# Patient Record
Sex: Male | Born: 1935 | Race: White | Hispanic: No | Smoking: Never smoker
Health system: Southern US, Community
[De-identification: ages and names within clinical notes are randomized; demographics above are authoritative.]

---

## 2008-07-27 ENCOUNTER — Inpatient Hospital Stay (HOSPITAL_COMMUNITY): Admission: EM | Admit: 2008-07-27 | Discharge: 2008-07-29 | Payer: Self-pay | Admitting: Emergency Medicine

## 2008-07-28 ENCOUNTER — Encounter (INDEPENDENT_AMBULATORY_CARE_PROVIDER_SITE_OTHER): Payer: Self-pay | Admitting: Internal Medicine

## 2010-05-17 LAB — APTT: aPTT: 32 s (ref 24–37)

## 2010-05-17 LAB — COMPREHENSIVE METABOLIC PANEL
ALT: 85 U/L — ABNORMAL HIGH (ref 0–53)
AST: 64 U/L — ABNORMAL HIGH (ref 0–37)
Albumin: 2.5 g/dL — ABNORMAL LOW (ref 3.5–5.2)
CO2: 22 mEq/L (ref 19–32)
Calcium: 8.7 mg/dL (ref 8.4–10.5)
Creatinine, Ser: 2.29 mg/dL — ABNORMAL HIGH (ref 0.4–1.5)
GFR calc Af Amer: 33 mL/min — ABNORMAL LOW (ref >60)
GFR calc non Af Amer: 28 mL/min — ABNORMAL LOW (ref >60)
Sodium: 130 mEq/L — ABNORMAL LOW (ref 135–145)
Total Protein: 5.9 g/dL — ABNORMAL LOW (ref 6.0–8.3)

## 2010-05-17 LAB — BASIC METABOLIC PANEL
Calcium: 8.8 mg/dL (ref 8.4–10.5)
GFR calc Af Amer: 34 mL/min — ABNORMAL LOW (ref >60)
GFR calc non Af Amer: 28 mL/min — ABNORMAL LOW (ref >60)
Potassium: 4.4 mEq/L (ref 3.5–5.1)
Sodium: 138 mEq/L (ref 135–145)

## 2010-05-17 LAB — DIFFERENTIAL
Basophils Absolute: 0 K/uL (ref 0.0–0.1)
Basophils Relative: 0 % (ref 0–1)
Eosinophils Absolute: 0.3 10*3/uL (ref 0.0–0.7)
Eosinophils Relative: 2 % (ref 0–5)
Lymphocytes Relative: 8 % — ABNORMAL LOW (ref 12–46)
Lymphs Abs: 0.9 10*3/uL (ref 0.7–4.0)
Monocytes Absolute: 0.6 10*3/uL (ref 0.1–1.0)
Monocytes Relative: 6 % (ref 3–12)
Neutro Abs: 9.1 K/uL — ABNORMAL HIGH (ref 1.7–7.7)
Neutrophils Relative %: 84 % — ABNORMAL HIGH (ref 43–77)

## 2010-05-17 LAB — COMPREHENSIVE METABOLIC PANEL WITH GFR
Alkaline Phosphatase: 81 U/L (ref 39–117)
BUN: 63 mg/dL — ABNORMAL HIGH (ref 6–23)
Chloride: 98 meq/L (ref 96–112)
Glucose, Bld: 116 mg/dL — ABNORMAL HIGH (ref 70–99)
Potassium: 4 meq/L (ref 3.5–5.1)
Total Bilirubin: 1 mg/dL (ref 0.3–1.2)

## 2010-05-17 LAB — URINALYSIS, ROUTINE W REFLEX MICROSCOPIC
Bilirubin Urine: NEGATIVE
Glucose, UA: NEGATIVE mg/dL
Hgb urine dipstick: NEGATIVE
Ketones, ur: NEGATIVE mg/dL
Nitrite: NEGATIVE
Protein, ur: NEGATIVE mg/dL
Specific Gravity, Urine: 1.012 (ref 1.005–1.030)
Urobilinogen, UA: 1 mg/dL (ref 0.0–1.0)
pH: 5 (ref 5.0–8.0)

## 2010-05-17 LAB — CBC
HCT: 35.9 % — ABNORMAL LOW (ref 39.0–52.0)
HCT: 37.9 % — ABNORMAL LOW (ref 39.0–52.0)
Hemoglobin: 12.2 g/dL — ABNORMAL LOW (ref 13.0–17.0)
Hemoglobin: 12.8 g/dL — ABNORMAL LOW (ref 13.0–17.0)
MCHC: 33.7 g/dL (ref 30.0–36.0)
MCV: 90.2 fL (ref 78.0–100.0)
Platelets: 207 10*3/uL (ref 150–400)
RBC: 3.96 MIL/uL — ABNORMAL LOW (ref 4.22–5.81)
RBC: 4.2 MIL/uL — ABNORMAL LOW (ref 4.22–5.81)
RDW: 14.9 % (ref 11.5–15.5)
WBC: 10.8 K/uL — ABNORMAL HIGH (ref 4.0–10.5)
WBC: 8.6 10*3/uL (ref 4.0–10.5)

## 2010-05-17 LAB — GLUCOSE, CAPILLARY
Glucose-Capillary: 113 mg/dL — ABNORMAL HIGH (ref 70–99)
Glucose-Capillary: 129 mg/dL — ABNORMAL HIGH (ref 70–99)
Glucose-Capillary: 142 mg/dL — ABNORMAL HIGH (ref 70–99)
Glucose-Capillary: 143 mg/dL — ABNORMAL HIGH (ref 70–99)
Glucose-Capillary: 144 mg/dL — ABNORMAL HIGH (ref 70–99)
Glucose-Capillary: 160 mg/dL — ABNORMAL HIGH (ref 70–99)
Glucose-Capillary: 191 mg/dL — ABNORMAL HIGH (ref 70–99)

## 2010-05-17 LAB — CULTURE, BLOOD (ROUTINE X 2): Culture: NO GROWTH

## 2010-05-17 LAB — BRAIN NATRIURETIC PEPTIDE: Pro B Natriuretic peptide (BNP): 58 pg/mL (ref 0.0–100.0)

## 2010-05-17 LAB — HEMOGLOBIN A1C: Hgb A1c MFr Bld: 6.3 % — ABNORMAL HIGH (ref 4.6–6.1)

## 2010-05-17 LAB — SEDIMENTATION RATE: Sed Rate: 74 mm/hr — ABNORMAL HIGH (ref 0–16)

## 2010-05-17 LAB — PROTIME-INR
INR: 1.1 (ref 0.00–1.49)
Prothrombin Time: 14.2 s (ref 11.6–15.2)

## 2010-05-17 LAB — HEPATIC FUNCTION PANEL
ALT: 71 U/L — ABNORMAL HIGH (ref 0–53)
AST: 51 U/L — ABNORMAL HIGH (ref 0–37)
Albumin: 2.4 g/dL — ABNORMAL LOW (ref 3.5–5.2)
Alkaline Phosphatase: 71 U/L (ref 39–117)
Bilirubin, Direct: 0.4 mg/dL — ABNORMAL HIGH (ref 0.0–0.3)
Total Bilirubin: 1.3 mg/dL — ABNORMAL HIGH (ref 0.3–1.2)

## 2010-05-17 LAB — WOUND CULTURE

## 2010-05-17 LAB — ANA: Anti Nuclear Antibody(ANA): NEGATIVE

## 2010-05-17 LAB — URINE CULTURE

## 2010-05-17 LAB — LIPID PANEL
HDL: 37 mg/dL — ABNORMAL LOW (ref >39)
Total CHOL/HDL Ratio: 2.9 RATIO

## 2010-06-22 NOTE — Discharge Summary (Signed)
NAME:  Brandon Floyd, Brandon Floyd NO.:  [id number]   MEDICAL RECORD NO.:  [id number]          PATIENT TYPE:  INP   LOCATION:  6706                         FACILITY:  [facility]   PHYSICIAN:  Charlestine Massed, MDDATE OF BIRTH:  09-05-1925   DATE OF ADMISSION:  07/27/2008  DATE OF DISCHARGE:  07/29/2008                               DISCHARGE SUMMARY   PRIMARY CARE PHYSICIAN:  Paulina Fusi, MD, at [facility].   DERMATOLOGIST:  Bufford Buttner, MD, [facility].   REASON FOR ADMISSION:  Multiple bullous lesions with ulcerations on the  upper and lower extremities and bilateral lower and upper extremity  edema.   DISCHARGE DIAGNOSES:  1. Chronic diastolic heart failure grade 1 with bilateral upper and      lower extremity edema.  2. Chronic kidney disease, stage III, currently stable.  3. Multiple skin blisters with ulcerations secondary to fluid overload      in both lower and upper extremities.  4. Topical wound infection with Staphylococcus aureus but no evidence      of active infection or cellulitis in the wound.  5. Hypertension, currently controlled.  6. Previous history of renal cell carcinoma status post nephrectomy on      the left side.  7. Status post implantable cardioverter-defibrillator placement - no      known reason available as of now.  8. Dyslipidemia.   DISCHARGE MEDICATIONS:  1. Bactrim DS 1 tablet p.o. b.i.d. x10 days.  2. Bactroban topical ointment topical application, twice daily to      wounds.  3. Lasix 80 mg p.o. daily a.m.  4. Toprol-XL 50 mg p.o. daily.  5. Hydralazine 20 mg p.o. t.i.d.   Continue the existing medications which he has been taking as:  1. Vytorin 10/20 half tablet daily.  2. Vicodin 5/500 one tablet q.6 hourly p.r.n.  3. Senna 1 tablet.  4. Soma 350 mg 1 tablet daily p.r.n.  5. Capoten 50 mg t.i.d.  6. Klor-Con 10 mEq p.o. daily.   The medication that were stopped were prednisone Robaxin,  Imdur.   HOSPITAL COURSE:  Mr. Brandon Floyd is an 77 year old gentleman who was  admitted with multiple ulcerations in both upper and lower extremities  associated with edema in both upper and lower extremities.  The patient  was initially evaluated, and initially he was found to have multiple  skin ulcers and the areas of edema present.  Dermatology consult was  called for and seen by Dr. Leonie Man who mentioned that the patient's  edema is the main reason for this ulceration.  There is no evidence of  active infection or cellulitis associated with the bones.  Samples of  culture from the wound was taken.  It is well known that the wound was  openly exposed all these days and the cultures grew Staphylococcus  aureus, sensitivity pending.  I spoke with the dermatologist who  suggested that if the culture is showing MRSA then the patient needs to  go on Bactrim DS and topical Bactroban will be enough for it otherwise,  with daily  wound care dressing and compression bandages for the fluid.  The patient's wound care has been set up.  He will be continued on  Bactrim DS for 10 days further and Bactroban topical ointment  applications.  Wound care advice has been given to clean the wound in  saline and apply topical Bactroban and then dry dressing twice daily  gained and apply compression bandages on top of them for the edema to  resolve.   The patient's condition was discussed with him and the family very well,  advised about decreasing fluid intake and daily weights to prevent  further fluid accumulation.  Family has expressed understanding about  it.   Wound Care has been set up for proper wound care at home as mentioned  earlier.   Diastolic heart failure.  The patient had an echocardiogram done at this  admission which reveals an ejection fraction of 55-60%, no wall motion  abnormalities, grade 1 diastolic dysfunction present.  PA pressure is  35.  There are no aortic stenosis or  aortic regurgitation or mitral  stenosis or mitral regurgitation noted.  The right ventricular function  was normal.  Systolic function was normal, and there was no pericardial  effusion.  In view of the diastolic dysfunction, the patient has been  started on Lasix 80 mg p.o. daily in morning.  He has been taken off the  Imdur because of the fact that the Imdur being a vasodilator can  increase lower extremity swelling.  He has been switched to hydralazine  daily and Lasix and Toprol-XL has been increased to 50 mg.   Hypertension.  Blood pressure is well controlled currently.  He has been  restarted on hydralazine for proper blood sugar and blood pressure  control and continue Toprol-XL and Lasix.   Peripheral edema.  The patient has been educated about keeping his leg  elevated while sleeping as well as to keep his upper extremities above  the level of the chest by using pillow while asleep.  Fluid restriction  to 1.2 L a day has been advised.  The patient has exhibited  understanding.  He has been advised to watch his weight, document every  other day weight using bathroom scale at the same spot every time and  log it and to contact the PMD if there is any sudden increase in the  body weight.   Dyslipidemia.  Continue Vytorin as before.   DISPOSITION:  Discharged back home.   FOLLOWUP:  1. Followup with Dr. Leonie Man, call [phone] for appointment to      follow up in 2-3 weeks.  2. Followup with primary MD, Dr. Tomasa Blase, in 1 week for blood work.   TESTS DONE DURING THIS ADMISSION:  1. BMP done on July 28, 2008, shows sodium 138, potassium 4.5,      chloride 104, bicarb 24, BUN 63, creatinine 2.28, and glucose 148.  2. The echocardiogram as dictated above showed grade 1 diastolic      dysfunction.  3. Renal ultrasound.  Left kidney is absent.  Right kidney is 12.2 in      size.  No hydronephrosis.  Simple cyst present.  Bladder is within      normal limits.  No obstructive  uropathy noted.  4. Chest x-ray, central bibasilar air space.  An AICD present on the      left side.  Cardiomegaly and basilar edema present.   Total of 40 minutes spent on this discharge.  Charlestine Massed, MD  Electronically Signed     UT/MEDQ  D:  07/29/2008  T:  07/30/2008  Job:  [id number]   cc:   Paulina Fusi, MD  Bufford Buttner, MD

## 2010-06-22 NOTE — H&P (Signed)
NAME:  Brandon Floyd, Brandon Floyd NO.:  [id number]   MEDICAL RECORD NO.:  [id number]          PATIENT TYPE:  EMS   LOCATION:  MAJO                         FACILITY:  [facility]   PHYSICIAN:  Eduard Clos, MDDATE OF BIRTH:  1925-06-14   DATE OF ADMISSION:  07/27/2008  DATE OF DISCHARGE:                              HISTORY & PHYSICAL   PRIMARY CARE PHYSICIAN:  Dr. Tomasa Blase at [place].   CHIEF COMPLAINT:  Increasing swelling all over the body.   HISTORY OF PRESENT ILLNESS:  An 77 year old male with known history of  CHF, systolic heart failure as per records, it shows 2008 EF was 40%,  status post ICD and defibrillator placement in [facility] with history  of renal cell carcinoma status post left-sided nephrectomy last year  with increasing swelling which started off 2 weeks ago.  The patient  stated that he had his right knee pain for which primary care doctor had  prescribed a tapering dose of prednisone which he had taken 3 days ago  and stopped.  He also noticed swelling starting off in lower extremities  along with some blisters developing in 3 areas of his body, posterior  and left ankle area, and now in the right ankle area and one in the  right elbow area.  He showed measuring at least 5 cm in diameter and is  weeping.  Has no obvious pus, but is serous and slightly thick.  The  patient denies any nausea, vomiting, abdominal pain, fever or chills,  difficulty breathing or tongue swelling or any blisters in his mouth.  Denies any headache.  Due to increasing swelling, the patient came to  the ER his creatinine was found to be 2.2 with all the swelling on x-ray  showing some edema.  The patient has been admitted for further  observation and management.  Presently the patient is not in any acute  distress.   PAST MEDICAL HISTORY:  Chronic kidney disease with history of left side  nephrectomy for renal cell carcinoma, hypertension, cardiomyopathy,  hyperlipidemia, ICD  placement.   PAST SURGICAL HISTORY:  Left sided nephrectomy for renal cell carcinoma  and ICD and pacemaker placement for cardiomyopathy.   MEDICATIONS ON ADMISSION:  1. Lasix 40 mg daily.  2. Captopril 50 mg three times daily.  3. Metoprolol ER 25 grams p.o. daily.  4. Imdur 30 mg p.o. daily.  5. Vytorin 10/20 mg daily.  6. Klor-Con 10 mEq p.o. daily.   ALLERGIES:  No known drug allergies.   FAMILY HISTORY:  Noncontributory.   SOCIALLY:  The patient lives with the family.  Denies smoking  cigarettes, drinking alcohol, or using illegal drugs.   REVIEW OF SYSTEMS:  Per history of present illness.  Nothing else  significant.   EXAMINATION:  CONSTITUTIONAL:  The patient examined at bedside not in  acute distress.  VITAL SIGNS:  Blood pressure is 130/60, pulse 90, temperature 98,  respirations 18, O2 sat 98%.  HEENT:  Anicteric.  No pallor.  No obvious of blisters or swelling on  tongue or mouth.  There is  no facial edema or swelling.  PERRLA  positive.  CHEST:  Bilateral air entry present mild basal crepitation bilaterally.  No rhonchi.  HEART:  S1, S2 heard.  ABDOMEN:  Soft, nontender.  Bowel sounds heard.  CNS:  Patient alert and oriented to person.  EXTREMITIES:  There is a 2+ edema both lower extremities up to mid thigh  and has blisters as mentioned earlier in the left and right ankles, both  of them weeping, and there is also blister in the right elbow area has  been dressed presently.   LABS:  EKG normal sinus rhythm.  No acute ST-T changes.  Chest x-ray  shows cardiomegaly with the base edema.  AICD device as noted.  CBC WBCs  10.8, hemoglobin 12.8, hematocrit 37.9, platelets 207,000, neutrophils  84%, PT/INR 14.2 and 1.1 with complete metabolic panel sodium 130,  potassium 4, chloride 98, carbon dioxide 22, glucose 116, BUN 63,  creatinine 2.2, total bilirubin 1, alkaline phosphatase 81, AST 64, ALT  85, total protein 5.9, albumin 2.5, calcium 8.7, BNP 5080.  UA  is  negative for nitrites and protein.   ASSESSMENT:  1. Acute renal failure.  His last creatinine shown in the records is      1.3 in 2008.  2. Anasarca are from acute renal failure.  3. Skin blisters, etiology not clear.  4. Hypertension.  5. History of coronary artery disease.  6. Cardiomyopathy with history of ICD and pacemaker placement.  7. History of left sided nephrectomy for renal cell carcinoma.   PLAN:  1. Will admit the patient to telemetry.  2. Will place the patient on Lasix for his fluid overload, closely      follow intake, output, daily weights and get a renal sonogram and      hold off his captopril for now.  Will place the patient on p.r.n.      hydralazine and continue with metoprolol for hypertension.  3. For skin blisters, will empirically cover with vancomycin and get      wound cultures and blood cultures.  Will also place the patient on      Solu-Medrol to cover for possible allergic reaction.  4. Follow blood cultures, urine cultures and will need dermatology      consult whom I will try to contact and give information.  5. We will also consult nephrology.  Further recommendation as      condition evolves.      Eduard Clos, MD  Electronically Signed     ANK/MEDQ  D:  07/27/2008  T:  07/27/2008  Job:  [id number]

## 2010-08-14 IMAGING — US US RENAL
1 series · 14 of 17 positions shown · non-contrast
Comparison: none

CLINICAL DATA: Renal failure

RENAL/URINARY TRACT ULTRASOUND

[Series 1: us renal · 0.33mm/px · 14 of 17 slices shown]
[im 1/17]
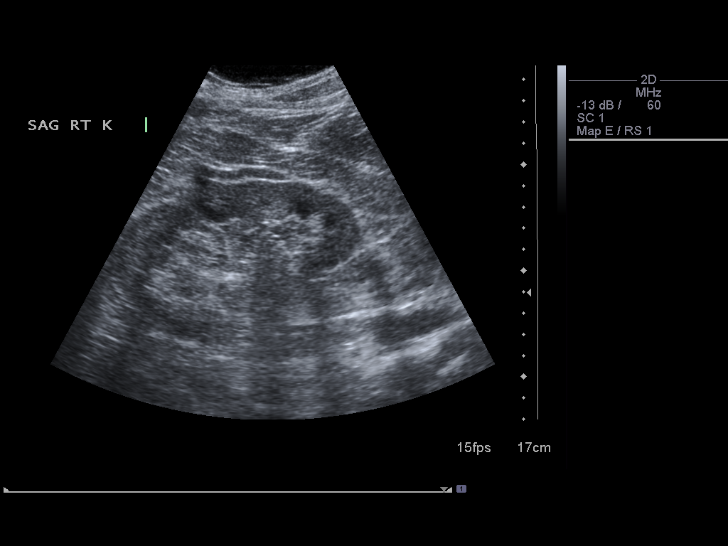
[im 2/17]
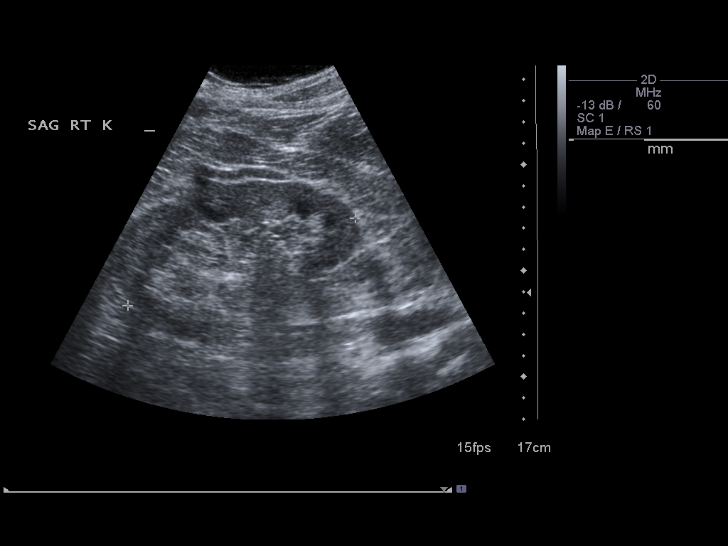
[im 4/17]
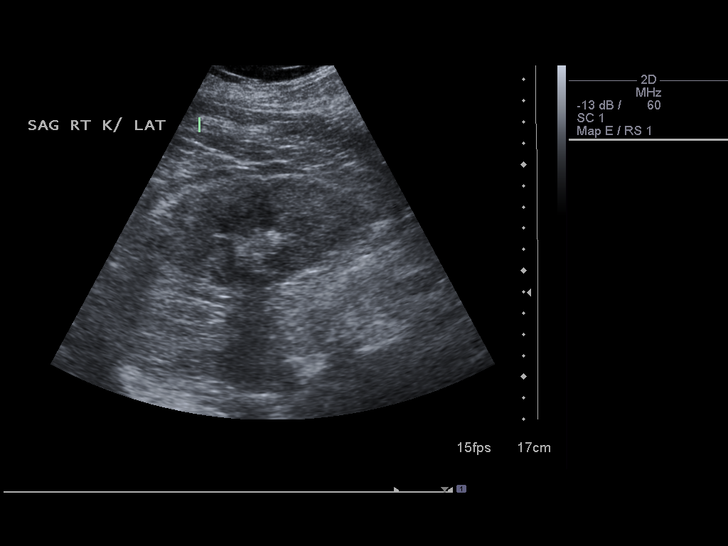
[im 5/17]
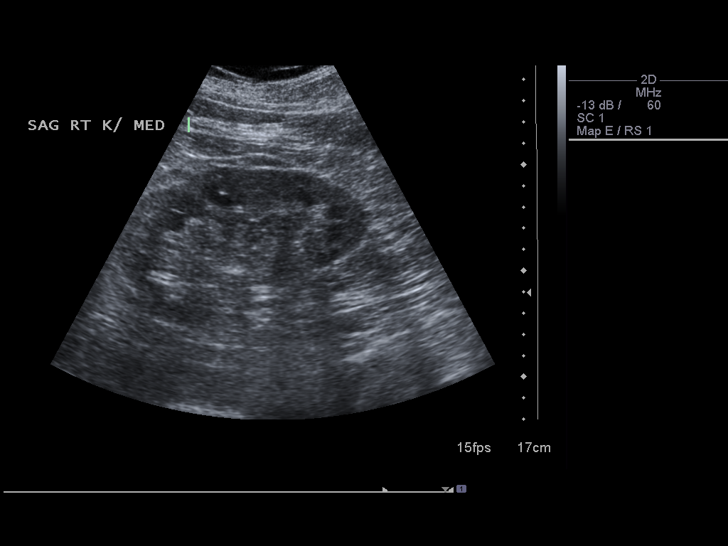
[im 6/17]
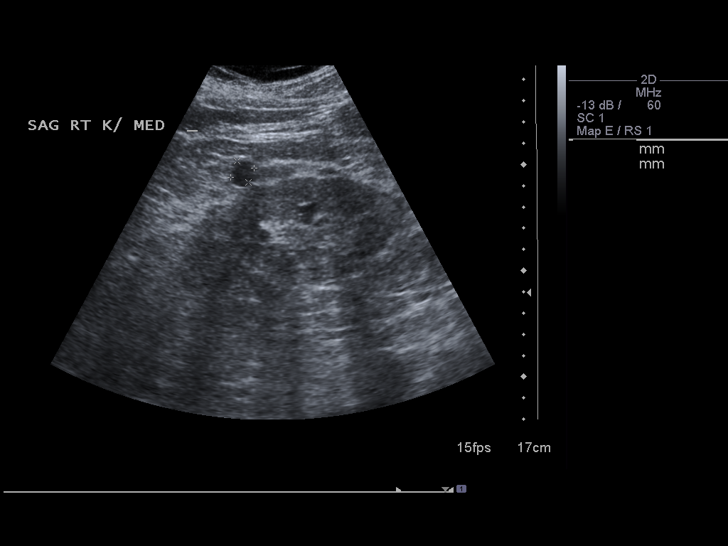
[im 7/17]
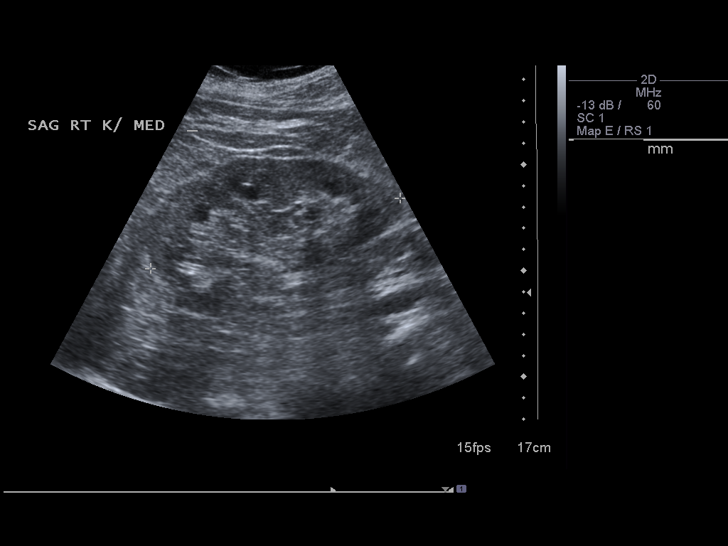
[im 8/17]
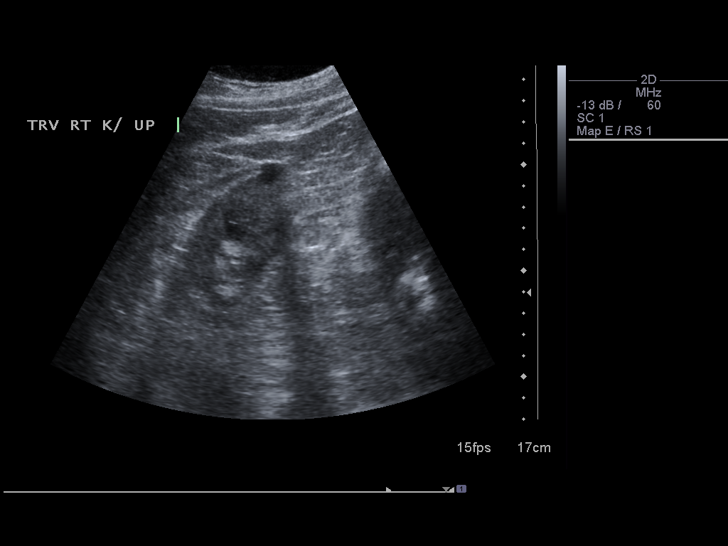
[im 10/17]
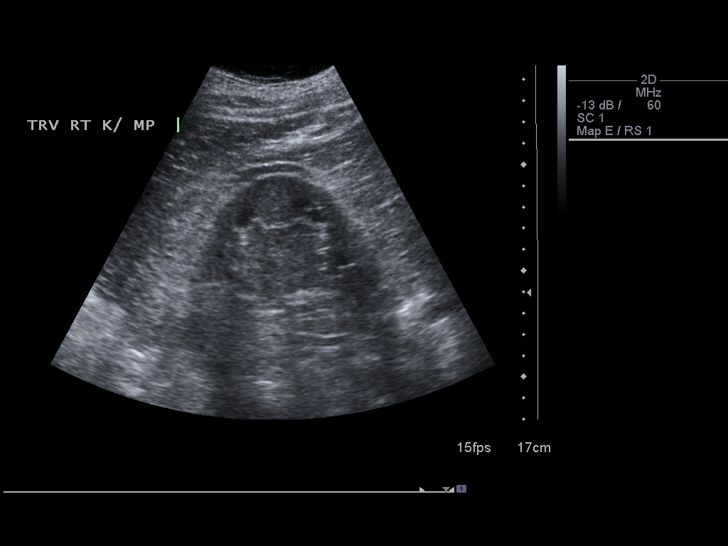
[im 11/17]
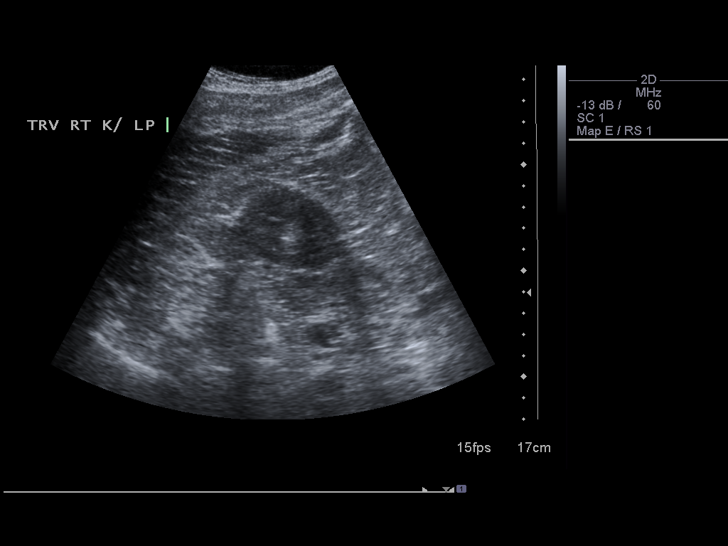
[im 12/17]
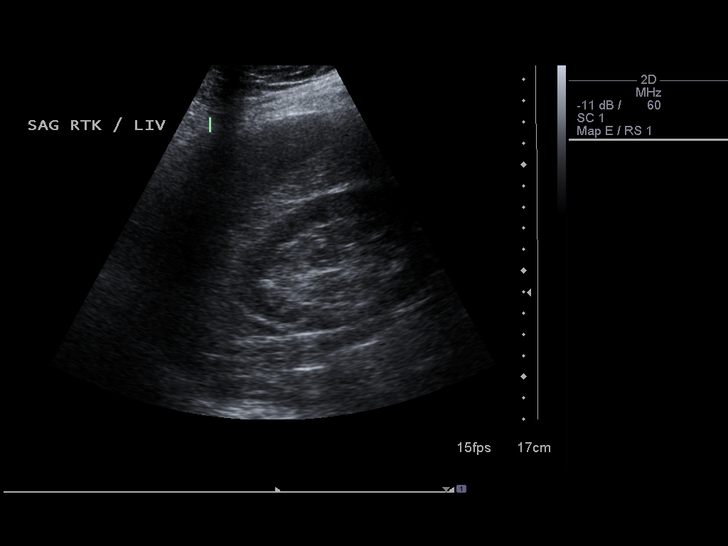
[im 13/17]
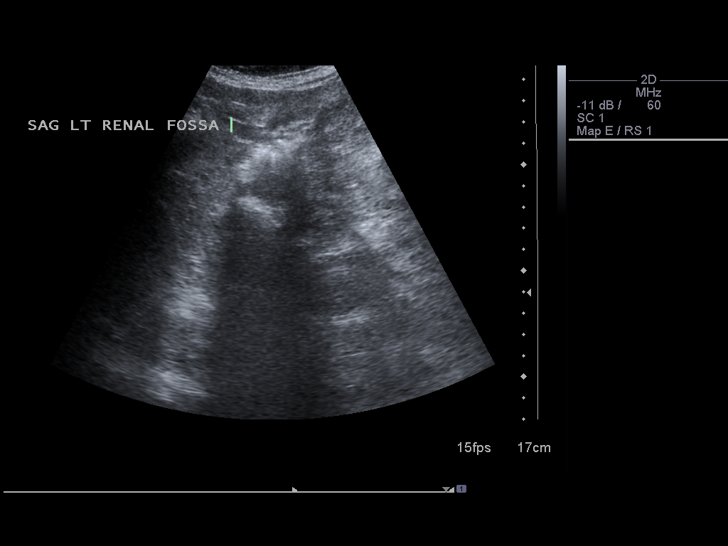
[im 14/17]
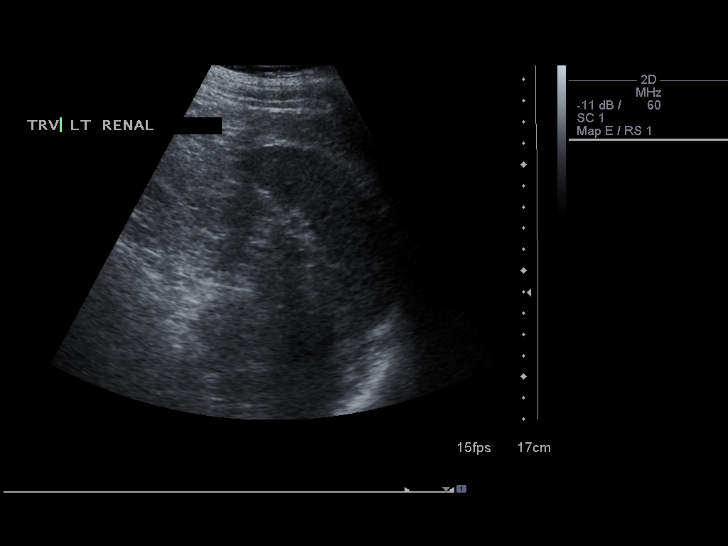
[im 16/17]
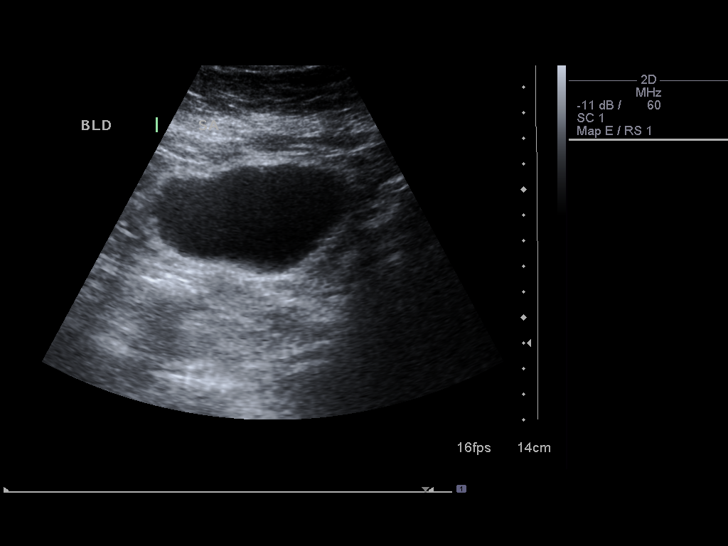
[im 17/17]
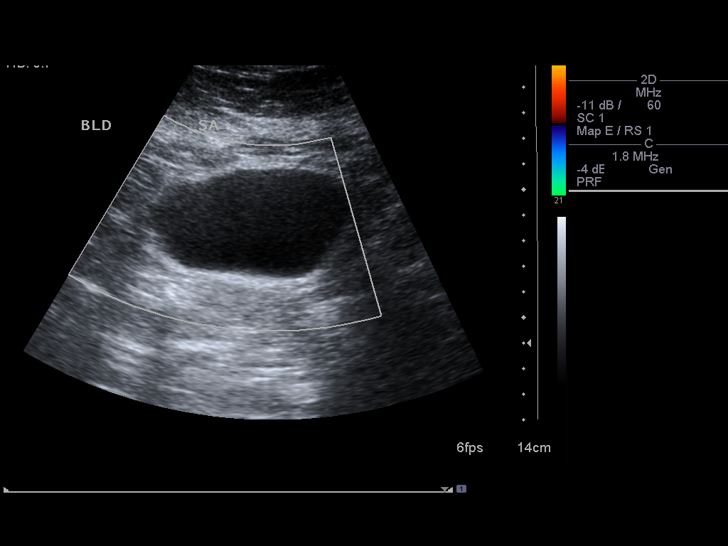

[14 of 17 positions shown; findings below may reference images not displayed]

FINDINGS: The left kidney is not visualized consistent with a
history of nephrectomy.

The right kidney is 12.2 cm in length.  There is a small simple
cyst in the interpolar region which is exophytic.  No
hydronephrosis or solid mass.  Bladder is within normal limits.
IMPRESSION: Status post left nephrectomy.

No evidence of right hydronephrosis.

## 2014-09-27 ENCOUNTER — Inpatient Hospital Stay (HOSPITAL_COMMUNITY)
Admission: AD | Admit: 2014-09-27 | Discharge: 2014-10-07 | DRG: 438 | Disposition: A | Discharge status: Home Health | Payer: Medicare Other | Source: Other Acute Inpatient Hospital | Attending: Internal Medicine | Admitting: Internal Medicine

## 2014-09-27 ENCOUNTER — Inpatient Hospital Stay (HOSPITAL_COMMUNITY): Payer: Medicare Other

## 2014-09-27 ENCOUNTER — Encounter (HOSPITAL_COMMUNITY): Payer: Self-pay | Admitting: Family Medicine

## 2014-09-27 DIAGNOSIS — I5033 Acute on chronic diastolic (congestive) heart failure: Secondary | ICD-10-CM | POA: Diagnosis present

## 2014-09-27 DIAGNOSIS — K8050 Calculus of bile duct without cholangitis or cholecystitis without obstruction: Secondary | ICD-10-CM | POA: Diagnosis not present

## 2014-09-27 DIAGNOSIS — E876 Hypokalemia: Secondary | ICD-10-CM | POA: Clinically undetermined

## 2014-09-27 DIAGNOSIS — I509 Heart failure, unspecified: Secondary | ICD-10-CM | POA: Insufficient documentation

## 2014-09-27 DIAGNOSIS — L89310 Pressure ulcer of right buttock, unstageable: Secondary | ICD-10-CM | POA: Diagnosis not present

## 2014-09-27 DIAGNOSIS — K8062 Calculus of gallbladder and bile duct with acute cholecystitis without obstruction: Secondary | ICD-10-CM | POA: Diagnosis present

## 2014-09-27 DIAGNOSIS — Z905 Acquired absence of kidney: Secondary | ICD-10-CM | POA: Diagnosis present

## 2014-09-27 DIAGNOSIS — Z79899 Other long term (current) drug therapy: Secondary | ICD-10-CM | POA: Diagnosis not present

## 2014-09-27 DIAGNOSIS — I5032 Chronic diastolic (congestive) heart failure: Secondary | ICD-10-CM | POA: Diagnosis not present

## 2014-09-27 DIAGNOSIS — E162 Hypoglycemia, unspecified: Secondary | ICD-10-CM | POA: Diagnosis present

## 2014-09-27 DIAGNOSIS — J449 Chronic obstructive pulmonary disease, unspecified: Secondary | ICD-10-CM | POA: Diagnosis present

## 2014-09-27 DIAGNOSIS — D509 Iron deficiency anemia, unspecified: Secondary | ICD-10-CM | POA: Diagnosis not present

## 2014-09-27 DIAGNOSIS — Z85528 Personal history of other malignant neoplasm of kidney: Secondary | ICD-10-CM

## 2014-09-27 DIAGNOSIS — R739 Hyperglycemia, unspecified: Secondary | ICD-10-CM | POA: Diagnosis not present

## 2014-09-27 DIAGNOSIS — I10 Essential (primary) hypertension: Secondary | ICD-10-CM | POA: Insufficient documentation

## 2014-09-27 DIAGNOSIS — B370 Candidal stomatitis: Secondary | ICD-10-CM | POA: Diagnosis present

## 2014-09-27 DIAGNOSIS — N184 Chronic kidney disease, stage 4 (severe): Secondary | ICD-10-CM | POA: Diagnosis present

## 2014-09-27 DIAGNOSIS — Z66 Do not resuscitate: Secondary | ICD-10-CM | POA: Diagnosis present

## 2014-09-27 DIAGNOSIS — Z7982 Long term (current) use of aspirin: Secondary | ICD-10-CM

## 2014-09-27 DIAGNOSIS — R0600 Dyspnea, unspecified: Secondary | ICD-10-CM

## 2014-09-27 DIAGNOSIS — K851 Biliary acute pancreatitis without necrosis or infection: Secondary | ICD-10-CM | POA: Diagnosis present

## 2014-09-27 DIAGNOSIS — K8020 Calculus of gallbladder without cholecystitis without obstruction: Secondary | ICD-10-CM | POA: Diagnosis not present

## 2014-09-27 DIAGNOSIS — L8990 Pressure ulcer of unspecified site, unspecified stage: Secondary | ICD-10-CM | POA: Diagnosis not present

## 2014-09-27 DIAGNOSIS — I129 Hypertensive chronic kidney disease with stage 1 through stage 4 chronic kidney disease, or unspecified chronic kidney disease: Secondary | ICD-10-CM | POA: Diagnosis present

## 2014-09-27 DIAGNOSIS — Z950 Presence of cardiac pacemaker: Secondary | ICD-10-CM | POA: Diagnosis not present

## 2014-09-27 DIAGNOSIS — E785 Hyperlipidemia, unspecified: Secondary | ICD-10-CM | POA: Diagnosis present

## 2014-09-27 DIAGNOSIS — I429 Cardiomyopathy, unspecified: Secondary | ICD-10-CM | POA: Diagnosis present

## 2014-09-27 DIAGNOSIS — K219 Gastro-esophageal reflux disease without esophagitis: Secondary | ICD-10-CM | POA: Insufficient documentation

## 2014-09-27 DIAGNOSIS — K859 Acute pancreatitis without necrosis or infection, unspecified: Secondary | ICD-10-CM | POA: Diagnosis present

## 2014-09-27 DIAGNOSIS — R109 Unspecified abdominal pain: Secondary | ICD-10-CM | POA: Diagnosis present

## 2014-09-27 DIAGNOSIS — K810 Acute cholecystitis: Secondary | ICD-10-CM | POA: Diagnosis present

## 2014-09-27 DIAGNOSIS — D638 Anemia in other chronic diseases classified elsewhere: Secondary | ICD-10-CM | POA: Diagnosis present

## 2014-09-27 LAB — CBC
HCT: 29.5 % — ABNORMAL LOW (ref 39.0–52.0)
HEMOGLOBIN: 9 g/dL — AB (ref 13.0–17.0)
MCH: 25.6 pg — AB (ref 26.0–34.0)
MCHC: 30.5 g/dL (ref 30.0–36.0)
MCV: 84 fL (ref 78.0–100.0)
Platelets: 129 10*3/uL — ABNORMAL LOW (ref 150–400)
RBC: 3.51 MIL/uL — AB (ref 4.22–5.81)
RDW: 17.5 % — ABNORMAL HIGH (ref 11.5–15.5)
WBC: 9.3 10*3/uL (ref 4.0–10.5)

## 2014-09-27 LAB — IRON AND TIBC
IRON: 12 ug/dL — AB (ref 45–182)
Saturation Ratios: 4 % — ABNORMAL LOW (ref 17.9–39.5)
TIBC: 304 ug/dL (ref 250–450)
UIBC: 292 ug/dL

## 2014-09-27 LAB — URINALYSIS, ROUTINE W REFLEX MICROSCOPIC
Bilirubin Urine: NEGATIVE
GLUCOSE, UA: NEGATIVE mg/dL
HGB URINE DIPSTICK: NEGATIVE
Ketones, ur: NEGATIVE mg/dL
Leukocytes, UA: NEGATIVE
Nitrite: NEGATIVE
Protein, ur: NEGATIVE mg/dL
SPECIFIC GRAVITY, URINE: 1.014 (ref 1.005–1.030)
Urobilinogen, UA: 1 mg/dL (ref 0.0–1.0)
pH: 5 (ref 5.0–8.0)

## 2014-09-27 LAB — BASIC METABOLIC PANEL
ANION GAP: 10 (ref 5–15)
BUN: 57 mg/dL — ABNORMAL HIGH (ref 6–20)
CALCIUM: 8.9 mg/dL (ref 8.9–10.3)
CO2: 26 mmol/L (ref 22–32)
Chloride: 107 mmol/L (ref 101–111)
Creatinine, Ser: 2.21 mg/dL — ABNORMAL HIGH (ref 0.61–1.24)
GFR, EST AFRICAN AMERICAN: 29 mL/min — AB (ref >60)
GFR, EST NON AFRICAN AMERICAN: 25 mL/min — AB (ref >60)
Glucose, Bld: 94 mg/dL (ref 65–99)
Potassium: 2.9 mmol/L — ABNORMAL LOW (ref 3.5–5.1)
SODIUM: 143 mmol/L (ref 135–145)

## 2014-09-27 LAB — LIPID PANEL
CHOL/HDL RATIO: 4.9 ratio
Cholesterol: 84 mg/dL (ref 0–200)
HDL: 17 mg/dL — ABNORMAL LOW (ref >40)
LDL Cholesterol: 41 mg/dL (ref 0–99)
Triglycerides: 129 mg/dL (ref <150)
VLDL: 26 mg/dL (ref 0–40)

## 2014-09-27 LAB — FERRITIN: FERRITIN: 65 ng/mL (ref 24–336)

## 2014-09-27 LAB — VITAMIN B12: Vitamin B-12: 1799 pg/mL — ABNORMAL HIGH (ref 180–914)

## 2014-09-27 LAB — TSH: TSH: 2.037 u[IU]/mL (ref 0.350–4.500)

## 2014-09-27 MED ORDER — METOPROLOL SUCCINATE ER 50 MG PO TB24
50.0000 mg | ORAL_TABLET | Freq: Every day | ORAL | Status: DC
  Administered 2014-09-27 – 2014-10-07 (×11): 50 mg via ORAL
  Filled 2014-09-27 (×11): qty 1

## 2014-09-27 MED ORDER — SODIUM CHLORIDE 0.9 % IV SOLN
INTRAVENOUS | Status: DC
  Administered 2014-09-27: 05:00:00 via INTRAVENOUS

## 2014-09-27 MED ORDER — CHLORHEXIDINE GLUCONATE 0.12 % MT SOLN
15.0000 mL | Freq: Two times a day (BID) | OROMUCOSAL | Status: DC
  Administered 2014-09-27 – 2014-10-07 (×17): 15 mL via OROMUCOSAL
  Filled 2014-09-27 (×13): qty 15

## 2014-09-27 MED ORDER — FAMOTIDINE 20 MG PO TABS
20.0000 mg | ORAL_TABLET | Freq: Every day | ORAL | Status: DC
  Administered 2014-09-27 – 2014-10-07 (×11): 20 mg via ORAL
  Filled 2014-09-27 (×11): qty 1

## 2014-09-27 MED ORDER — MORPHINE SULFATE (PF) 2 MG/ML IV SOLN
2.0000 mg | INTRAVENOUS | Status: DC | PRN
  Administered 2014-09-28 – 2014-10-07 (×24): 2 mg via INTRAVENOUS
  Filled 2014-09-27 (×24): qty 1

## 2014-09-27 MED ORDER — ACETAMINOPHEN 650 MG RE SUPP: 650.0000 mg | Freq: Four times a day (QID) | RECTAL | Status: DC | PRN

## 2014-09-27 MED ORDER — SIMVASTATIN 20 MG PO TABS
20.0000 mg | ORAL_TABLET | Freq: Every day | ORAL | Status: DC
  Administered 2014-09-27 – 2014-10-06 (×10): 20 mg via ORAL
  Filled 2014-09-27 (×10): qty 1

## 2014-09-27 MED ORDER — SODIUM CHLORIDE 0.9 % IV SOLN
3.0000 g | Freq: Four times a day (QID) | INTRAVENOUS | Status: DC
  Administered 2014-09-27 (×2): 3 g via INTRAVENOUS
  Filled 2014-09-27 (×4): qty 3

## 2014-09-27 MED ORDER — ONDANSETRON HCL 4 MG/2ML IJ SOLN
4.0000 mg | Freq: Four times a day (QID) | INTRAMUSCULAR | Status: DC | PRN
  Administered 2014-09-27 – 2014-10-05 (×2): 4 mg via INTRAVENOUS
  Filled 2014-09-27 (×2): qty 2

## 2014-09-27 MED ORDER — ALUM & MAG HYDROXIDE-SIMETH 200-200-20 MG/5ML PO SUSP: 30.0000 mL | Freq: Four times a day (QID) | ORAL | Status: DC | PRN

## 2014-09-27 MED ORDER — SODIUM CHLORIDE 0.9 % IV SOLN
1.5000 g | Freq: Two times a day (BID) | INTRAVENOUS | Status: DC
  Administered 2014-09-27 – 2014-10-01 (×9): 1.5 g via INTRAVENOUS
  Filled 2014-09-27 (×10): qty 1.5

## 2014-09-27 MED ORDER — ONDANSETRON HCL 4 MG PO TABS: 4.0000 mg | ORAL_TABLET | Freq: Four times a day (QID) | ORAL | Status: DC | PRN

## 2014-09-27 MED ORDER — CETYLPYRIDINIUM CHLORIDE 0.05 % MT LIQD
7.0000 mL | Freq: Two times a day (BID) | OROMUCOSAL | Status: DC
  Administered 2014-09-27 – 2014-10-06 (×15): 7 mL via OROMUCOSAL

## 2014-09-27 MED ORDER — SODIUM CHLORIDE 0.9 % IV SOLN
INTRAVENOUS | Status: DC
  Administered 2014-09-27 – 2014-09-28 (×2): via INTRAVENOUS
  Filled 2014-09-27 (×5): qty 1000

## 2014-09-27 MED ORDER — ACETAMINOPHEN 325 MG PO TABS
650.0000 mg | ORAL_TABLET | Freq: Four times a day (QID) | ORAL | Status: DC | PRN
  Administered 2014-09-27 – 2014-10-05 (×2): 650 mg via ORAL
  Filled 2014-09-27 (×2): qty 2

## 2014-09-27 MED ORDER — POTASSIUM CHLORIDE 10 MEQ/100ML IV SOLN
10.0000 meq | INTRAVENOUS | Status: AC
  Administered 2014-09-27 (×5): 10 meq via INTRAVENOUS
  Filled 2014-09-27: qty 100

## 2014-09-27 NOTE — H&P (Addendum)
History and Physical  [name] ZOX:[id number] DOB: 04/21/1925 DOA: 09/27/2014  Referring physician: Dr Ilsa Iha, ED physician PCP: Paulina Fusi, MD   Chief Complaint: Abdominal Pain, Fever  HPI: Brandon Floyd is a 81 y.o. male  With a history of hypertension, congestive heart failure, pacemaker, COPD, renal disease, partial left nephrectomy due to left kidney cancer per patient, who was [facility] to abdominal pain. His onset of pain has been over the past 24 hours and has been worsening.  His pain is located in the right upper quadrant, left upper quadrant, and epigastric areas. Pain is worse with movement, palpation, eating. It is improved with pain medicines. He was evaluated at [facility] where he was discovered he had a lipase in the 3000 range along with a gallstone in the pancreatic duct. The ER physician discussed this patient with GI as there was not the capability of performing an ERCP at [facility]. The patient was extended for transfer and GI will consult on the patient in the morning.  From outside hospital: #1 CBC shows white count of 15.5, hemoglobin 10.6, hematocrit 32.8, platelets 161. #2 CMP shows sodium of 142, potassium 3.5, chloride 99, bicarbonate 27, BUN 64, creatinine 2.4, glucose 107, total bili 2.8, AST 628, ALT 405, alkaline phosphatase 294, #3 lactic acid 2.1 #4 proBNP 2210, troponin 0.05, creatinine kinase 77, myoglobin 270 #5 lipase 3927 #6 ABG H pH 7.470, PCO2 35.0, PO2 78.0, bicarbonate 25.5  Review of Systems:   Pt complains of fevers, abdominal pain, nausea, vomiting.  Pt denies any chills, chest pain, shortness of breath, dyspnea on exertion, headaches, blurred vision, diarrhea, constipation, melena, rectal bleeding, bruising,.  Review of systems are otherwise negative  No past medical history on file. Patient is unclear with past medical history, but per the records from [facility] patient reports history of  hypertension, congestive heart failure, COPD, kidney disease, history of kidney cancer status post partial left nephrectomy, osteoarthritis, anemia.  No past surgical history on file.  Again, patient is unaware of surgical history although per the records he admitted to [facility] that he had a left partial nephrectomy and bladder surgery. He denies appendectomy, cholecystectomy, tonsillectomy  Social History:  reports that he has never smoked. He does not have any smokeless tobacco history on file. His alcohol and drug histories are not on file. Patient lives at home & is able to participate in activities of daily living  No Known Allergies  Family History  Problem Relation Age of Onset  . Diabetes Father      Prior to Admission medications   Not on File    Physical Exam: BP 129/44 mmHg  Pulse 98  Temp(Src) 98.2 F (36.8 C) (Axillary)  Resp 18  Ht 5\' 9"  (1.753 m)  Wt 70 kg (154 lb 5.2 oz)  BMI 22.78 kg/m2  SpO2 98%  General:  older Caucasian gentleman. Awake and alert and oriented x3. No acute cardiopulmonary distress.  Eyes: Pupils equal, round, reactive to light. Extraocular muscles are intact. Sclerae anicteric and noninjected.  ENT: External auditory canals are patent and tympanic membranes reflect a good cone of light. Moist mucosal membranes. No mucosal lesions.   Neck: Neck supple without lymphadenopathy. No carotid bruits. No masses palpated.  Cardiovascular: Regular rate with normal S1-S2 sounds. No murmurs, rubs, gallops auscultated. No JVD.  Respiratory: Good respiratory effort with no wheezes, rales, rhonchi. Lungs clear to auscultation bilaterally.  Abdomen: Soft, nontender, nondistended. Active bowel sounds. No  masses or hepatosplenomegaly  Skin: Dry, warm to touch. 2+ dorsalis pedis and radial pulses. Musculoskeletal: No calf or leg pain. All major joints not erythematous nontender.  Psychiatric: Intact judgment and insight.  Neurologic: No focal  neurological deficits. Cranial nerves II through XII are grossly intact.           Labs on Admission:  Basic Metabolic Panel: No results for input(s): NA, K, CL, CO2, GLUCOSE, BUN, CREATININE, CALCIUM, MG, PHOS in the last 168 hours. Liver Function Tests: No results for input(s): AST, ALT, ALKPHOS, BILITOT, PROT, ALBUMIN in the last 168 hours. No results for input(s): LIPASE, AMYLASE in the last 168 hours. No results for input(s): AMMONIA in the last 168 hours. CBC: No results for input(s): WBC, NEUTROABS, HGB, HCT, MCV, PLT in the last 168 hours. Cardiac Enzymes: No results for input(s): CKTOTAL, CKMB, CKMBINDEX, TROPONINI in the last 168 hours.  BNP (last 3 results) No results for input(s): BNP in the last 8760 hours.  ProBNP (last 3 results) No results for input(s): PROBNP in the last 8760 hours.  CBG: No results for input(s): GLUCAP in the last 168 hours.  Radiological Exams on Admission: No results found.   Assessment/Plan Present on Admission:  . Pancreatitis, gallstone . Pressure ulcer . Choledocholithiasis . Cholecystitis, acute   #1 pancreatitis  Admit  Consult GI  IV fluids 150 mL per hour (will be cautious given history of congestive heart failure)   Check lipase #2 pressure ulcer   consult wound care  #3 choledocholithiasis  Will need ERCP  Patient nothing by mouth  GI to consult  #4  Cholecystitis  Suspicious for cholecystitis given fever, elevated white count, pain right upper quadrant   Continue Unasyn   Check CBC #5 congestive heart failure  DVT prophylaxis: SCDs   Consultants: GI   Code Status: DO NOT RESUSCITATE   Family Communication:    Disposition Plan: Admit    Levie Heritage, DO [facility] Pager [phone]

## 2014-09-27 NOTE — Progress Notes (Signed)
Paged [facility] admissions at [phone] to inform MD of patient's arrival in 6N31

## 2014-09-27 NOTE — Consult Note (Signed)
[facility] CONSULT Reason for consult: Pancreatitis Referring Physician: [facility] PCP: Dr. Delena Bali. Primary G.I.: none patient is unassigned  Brandon Floyd is an 81 y.o. male.  HPI: he is transferred from [place]. He presented to the emergency room there with pancreatitis and a question of a sending cholangitis with CT scan suggesting possible CBD stone. Gastroenterology in [place] was contacted, and apparently [facility] does not perform ERCP's on weekends and have been instructed to send all of their biliary patients to [facility] on weekends if it is felt that ERCP could possibly be needed. The patient is postcholecystectomy many years ago. He thinks about 20 years ago but is unable to tell me exactly when. He has had 2 episodes of severe pain following in his chest back and shoulders. It also is in his upper abdomen he doesn't remember it went to his back or not. He presented to the [facility] emergency room and work up there revealed WBC 15, total bilirubin 2.8, ALT and AST elevated 400 to 600 and lipase elevated at about 4000. CT of the abdomen was performed. This revealed a very large calcification in the vicinity of the head of the pancreas with no ductal dilatation. His intrahepatic bile ducts were not dilated. This was non-contrasted study. I have reviewed the study with radiology here. It is felt calcifications are quite large and would be quite unusual to have stones as large without any ductal dilatation. Interestingly the pancreas look normal with no peripancreatic edema no sign of acute pancreatitis. Radiology felt that the possible explanation for the large calcifications which are estimated to be about a centimeter in size are: calcifications in the head of the pancreas, pill fragments trapped in a duodenal diverticulum, or large CBD/PD stone. This option is felt to be less likely given the large size of the calcifications and the lack of  dilated ducts. There was some minor pneumobilia. The patient feels much better today and denies any abdominal pain. He does have a pacemaker. The old records indicate that this could be a defibrillator. The patient is not sure. His other problems include a history of partial nephrectomy, a history of mild dementia. He states that he simply can't remember things the way he used.  History reviewed. No pertinent past medical history. history of congestive heart failure and placement of either pacemaker or ICD, history of renal cell cancer status post partial nephrectomy, hypertension, cardiomyopathy. History reviewed. No pertinent past surgical history.  Family History  Problem Relation Age of Onset  . Diabetes Father     Social History:  reports that he has never smoked. He does not have any smokeless tobacco history on file. His alcohol and drug histories are not on file.  Allergies: No Known Allergies  Medications; Prior to Admission medications   Medication Sig Start Date End Date Taking? Authorizing Provider  aspirin EC 81 MG tablet Take 81 mg by mouth daily.   Yes Historical Provider, MD  furosemide (LASIX) 80 MG tablet Take 80 mg by mouth 2 (two) times daily.   Yes Historical Provider, MD  hydrALAZINE (APRESOLINE) 25 MG tablet Take 25 mg by mouth 3 (three) times daily.   Yes Historical Provider, MD  metoprolol succinate (TOPROL-XL) 50 MG 24 hr tablet Take 50 mg by mouth daily. Take with or immediately following a meal.   Yes Historical Provider, MD  oxyCODONE-acetaminophen (PERCOCET) 10-325 MG per tablet Take 1 tablet by mouth every 6 (six) hours as needed for pain (  pain).   Yes Historical Provider, MD  potassium chloride (K-DUR,KLOR-CON) 10 MEQ tablet Take 10 mEq by mouth daily.   Yes Historical Provider, MD  ranitidine (ZANTAC) 150 MG tablet Take 150 mg by mouth 2 (two) times daily.   Yes Historical Provider, MD  simvastatin (ZOCOR) 20 MG tablet Take 20 mg by mouth daily at 6 PM.    Yes Historical Provider, MD   . ampicillin-sulbactam (UNASYN) IV  3 g Intravenous Q6H  . antiseptic oral rinse  7 mL Mouth Rinse q12n4p  . chlorhexidine  15 mL Mouth Rinse BID  . famotidine  20 mg Oral Daily  . metoprolol succinate  50 mg Oral Daily  . potassium chloride  10 mEq Intravenous Q1 Hr x 5  . simvastatin  20 mg Oral q1800   PRN Meds acetaminophen **OR** acetaminophen, alum & mag hydroxide-simeth, morphine injection, ondansetron **OR** ondansetron (ZOFRAN) IV Results for orders placed or performed during the hospital encounter of 09/27/14 (from the past 48 hour(s))  Basic metabolic panel     Status: Abnormal   Collection Time: 09/27/14  5:00 AM  Result Value Ref Range   Sodium 143 135 - 145 mmol/L   Potassium 2.9 (L) 3.5 - 5.1 mmol/L   Chloride 107 101 - 111 mmol/L   CO2 26 22 - 32 mmol/L   Glucose, Bld 94 65 - 99 mg/dL   BUN 57 (H) 6 - 20 mg/dL   Creatinine, Ser 2.21 (H) 0.61 - 1.24 mg/dL   Calcium 8.9 8.9 - 10.3 mg/dL   GFR calc non Af Amer 25 (L) >60 mL/min   GFR calc Af Amer 29 (L) >60 mL/min    Comment: (NOTE) The eGFR has been calculated using the CKD EPI equation. This calculation has not been validated in all clinical situations. eGFR's persistently <60 mL/min signify possible Chronic Kidney Disease.    Anion gap 10 5 - 15  CBC     Status: Abnormal   Collection Time: 09/27/14  5:00 AM  Result Value Ref Range   WBC 9.3 4.0 - 10.5 K/uL   RBC 3.51 (L) 4.22 - 5.81 MIL/uL   Hemoglobin 9.0 (L) 13.0 - 17.0 g/dL   HCT 29.5 (L) 39.0 - 52.0 %   MCV 84.0 78.0 - 100.0 fL   MCH 25.6 (L) 26.0 - 34.0 pg   MCHC 30.5 30.0 - 36.0 g/dL   RDW 17.5 (H) 11.5 - 15.5 %   Platelets 129 (L) 150 - 400 K/uL    No results found.             Blood pressure 136/51, pulse 60, temperature 98 F (36.7 C), temperature source Axillary, resp. rate 18, height 5' 9"  (1.753 m), weight 70 kg (154 lb 5.2 oz), SpO2 99 %.  Physical exam:   General-- pleasantly confused white  male in no acute distress ENT-- grossly nonicteric Neck-- no lymphadenopathy Heart-- regular rate and rhythm without murmurs or gallops Lungs-- clear Abdomen-- nondistended with excellent bowel sounds completely nontender Psych-- quite pleasant and answers questions appropriately, long-term memory somewhat lacking..   Assessment: 1. Acute pancreatitis. This was likely due to passage of stone material. He does have some pneumobilia on CT scan. There is no ductal dilatation and I'm not sure what to make of the large calcifications seen in the vicinity of the head of the pancreas. With lack of any ductal dilatation I doubt this is any kind of impacted stone. Unfortunately he has a pacemaker/ICD and MRCP would not  be possible. He clinically appears much improved and there does not seem to be an urgent need for ERCP at this time. 2. Status post cholecystectomy 3. Pacemaker or ICD 4. History of renal cell carcinoma status post partial nephrectomy  Plan: at this point I would simply follow him clinically and see what how his labs respond. Would follow liver test and lipase. I would keep him in PO today. If there is any question about impacted stone EUS may be the next logical test. Will follow with.   Rhyland Hinderliter JR,Shaquila Sigman L 09/27/2014, 8:36 AM   Pager: [phone] If no answer or after hours call [phone]

## 2014-09-27 NOTE — Consult Note (Signed)
WOC wound consult note Reason for Consult: Unstageable pressure injury on right buttocks. Patient states that this area has been here "a while", but that it is "getting better".  He says it is still "tender". Wound type:Pressure Pressure Ulcer POA: Yes Measurement: 0.8cm round with dried serum (scab) obscuring wound bed. I feel that this is not adherent eschar, but that it would be easily removed, however with tenderness, it is perhaps best to allow this to fall off spontaneously. Wound bed: As described above. Drainage (amount, consistency, odor) None Periwound: Intact, clear, dry Dressing procedure/placement/frequency: Patient is incontinent of small amount of stool at the time of my assessment and is with some blanching erythema in the periwound area that has dissipated by the time my assessment is complete.  Patient has family visiting (granddaughter and her husband) and all are instructed to have patient avoid the supine position and instead turn side to side while in bed.  He is provided with a pressure redistribution chair pad for OOB use in chair both here and after discharge. In the meantime, perinela care and cleansing following positioning and incontinence episodes will be performed with our house cleanser and moisture barrier cream application. Thank you for this consultation.  The WOC nursing team will not follow, but will remain available to this patient, the nursing and medical teams.  Please re-consult if needed. Thanks, Ladona Mow, MSN, RN, GNP, [place], [facility]ON-AP [phone])

## 2014-09-27 NOTE — Progress Notes (Addendum)
PROGRESS NOTE  Brandon Floyd TML:[id number] DOB: 24-May-1925 DOA: 09/27/2014 PCP: [name], MD  Brief history 81 year old male with a history of CKD stage IV, left-sided nephrectomy for renal cell carcinoma, hypertension, COPD, cardiomyopathy, and hyperlipidemia presented to [facility] ED with 24 hours of upper abdominal pain that was worse with meals. CT of the abdomen and pelvis at [facility] revealed small calcified structures of the head of the pancreas which may possibly be in the distal common bile duct near the ampulla. There was also a tiny amount of pneumobilia. The patient had cholecystectomy over 10 years ago. He was noted to have WBC 15.5, AST 628, ALT 405, alk phosphatase 294, total bilirubin 2.8, lipase 3927. However, the pancreas did not have any inflammatory changes on CT.   Because of concerns of cholangitis and choledocholithiasis, the patient was transferred to [facility] for possible ERCP.  [facility] has been consulted. Assessment/Plan: Acute pancreatitis/Transaminasemia/Hyperbilirubinemia -Likely due to recent passage of biliary stone  -pt s/p cholecystectomy as noted on CT abdomen at [facility] -Daughter states that he had biliary pancreatitis approximately 15-20 years ago when he had his GB removed -case was discussed with GI, Dr. [name]  -Dr. [name] spoke with radiology who felt felt that possible explanation for the large calcifications which are estimated to be about a centimeter in size are: calcifications in the head of the pancreas, pill fragments trapped in a duodenal diverticulum, or large CBD/PD stone -He did not feel pt needed urgent ERCP at this time as pt is clinically stable and likelihood of CBD/PD stone was low given lack of ductal dilatation on CT -Remain nothing by mouth except sips with meds -Continue IV fluids judiciously  -Plan for EUS early next week per Dr. [name] -Continue Unasyn -Check lipids  Hypertension  -Continue metoprolol succinate    Chronic diastolic CHF  -68/01/7516 echo EF 00-17%, grade 1 diastolic dysfunction  -s/p PPM--unclear reason per medical record and pt cannot clarify -daughter describes bradycardia/ some type of heart block as reason for PPM -CXR as pt c/o dyspnea -daily weights CKD stage IV  -Baseline creatinine 2.2-2.4  -Continue to monitor closely  Hyperlipidemia  -Continue statin  Hypokalemia  -Replete  -Check magnesium  Hyperglycemia  -Check hemoglobin A1c  Iron deficiency anemia/anemia of chronic disease -Check serum B12, RBC folate, iron studies Deconditioning/generalized weakness -PT evaluation -Patient usually ambulatory with walker -TSH -Urinalysis  Total time 60 min ([id number]) Family Communication:   Daughters updated at beside Disposition Plan:   Home when medically stable       Procedures/Studies: No results found.      Subjective: Patient denies any fevers, chills, nausea, vomiting, diarrhea. He describes some shortness of breath. Denies any chest discomfort, dysuria, hematuria. No rashes.  Objective: Filed Vitals:   09/27/14 0240 09/27/14 0636  BP: 129/44 136/51  Pulse: 98 60  Temp: 98.2 F (36.8 C) 98 F (36.7 C)  TempSrc: Axillary Axillary  Resp: 18 18  Height: 5' 9"  (1.753 m)   Weight: 70 kg (154 lb 5.2 oz)   SpO2: 98% 99%    Intake/Output Summary (Last 24 hours) at 09/27/14 1227 Last data filed at 09/27/14 0700  Gross per 24 hour  Intake  272.5 ml  Output    800 ml  Net -527.5 ml   Weight change:  Exam:   General:  Pt is alert, follows commands appropriately, not in acute distress  HEENT: No icterus, No thrush, No neck mass,  [place]/AT  Cardiovascular: RRR, S1/S2, no rubs, no gallops  Respiratory: CTA bilaterally, no wheezing, no crackles, no rhonchi  Abdomen: Soft/+BS, non tender, non distended, no guarding  Extremities: No edema, No lymphangitis, No petechiae, No rashes, no synovitis  Data Reviewed: Basic Metabolic Panel:  Recent  Labs Lab 09/27/14 0500  NA 143  K 2.9*  CL 107  CO2 26  GLUCOSE 94  BUN 57*  CREATININE 2.21*  CALCIUM 8.9   Liver Function Tests: No results for input(s): AST, ALT, ALKPHOS, BILITOT, PROT, ALBUMIN in the last 168 hours. No results for input(s): LIPASE, AMYLASE in the last 168 hours. No results for input(s): AMMONIA in the last 168 hours. CBC:  Recent Labs Lab 09/27/14 0500  WBC 9.3  HGB 9.0*  HCT 29.5*  MCV 84.0  PLT 129*   Cardiac Enzymes: No results for input(s): CKTOTAL, CKMB, CKMBINDEX, TROPONINI in the last 168 hours. BNP: Invalid input(s): POCBNP CBG: No results for input(s): GLUCAP in the last 168 hours.  No results found for this or any previous visit (from the past 240 hour(s)).   Scheduled Meds: . ampicillin-sulbactam (UNASYN) IV  3 g Intravenous Q6H  . antiseptic oral rinse  7 mL Mouth Rinse q12n4p  . chlorhexidine  15 mL Mouth Rinse BID  . famotidine  20 mg Oral Daily  . metoprolol succinate  50 mg Oral Daily  . potassium chloride  10 mEq Intravenous Q1 Hr x 5  . simvastatin  20 mg Oral q1800   Continuous Infusions: . sodium chloride 0.9 % 1,000 mL with potassium chloride 20 mEq infusion 100 mL/hr at 09/27/14 0951     Shade Kaley, DO  [facility] Pager [phone]  If 7PM-7AM, please contact night-coverage [email or web address] Password [email or web address] 09/27/2014, 12:27 PM   LOS: 0 days

## 2014-09-28 DIAGNOSIS — R739 Hyperglycemia, unspecified: Secondary | ICD-10-CM

## 2014-09-28 DIAGNOSIS — I5033 Acute on chronic diastolic (congestive) heart failure: Secondary | ICD-10-CM | POA: Diagnosis present

## 2014-09-28 LAB — COMPREHENSIVE METABOLIC PANEL
ALK PHOS: 177 U/L — AB (ref 38–126)
ALT: 155 U/L — AB (ref 17–63)
AST: 101 U/L — ABNORMAL HIGH (ref 15–41)
Albumin: 2.4 g/dL — ABNORMAL LOW (ref 3.5–5.0)
Anion gap: 10 (ref 5–15)
BUN: 45 mg/dL — ABNORMAL HIGH (ref 6–20)
CALCIUM: 8.8 mg/dL — AB (ref 8.9–10.3)
CO2: 23 mmol/L (ref 22–32)
CREATININE: 1.91 mg/dL — AB (ref 0.61–1.24)
Chloride: 113 mmol/L — ABNORMAL HIGH (ref 101–111)
GFR calc Af Amer: 35 mL/min — ABNORMAL LOW (ref >60)
GFR, EST NON AFRICAN AMERICAN: 30 mL/min — AB (ref >60)
Glucose, Bld: 67 mg/dL (ref 65–99)
Potassium: 3.6 mmol/L (ref 3.5–5.1)
Sodium: 146 mmol/L — ABNORMAL HIGH (ref 135–145)
Total Bilirubin: 1 mg/dL (ref 0.3–1.2)
Total Protein: 4.8 g/dL — ABNORMAL LOW (ref 6.5–8.1)

## 2014-09-28 LAB — URINE CULTURE: Culture: NO GROWTH

## 2014-09-28 LAB — CBC
HCT: 32 % — ABNORMAL LOW (ref 39.0–52.0)
HEMOGLOBIN: 9.3 g/dL — AB (ref 13.0–17.0)
MCH: 24.9 pg — AB (ref 26.0–34.0)
MCHC: 29.1 g/dL — ABNORMAL LOW (ref 30.0–36.0)
MCV: 85.8 fL (ref 78.0–100.0)
Platelets: 147 10*3/uL — ABNORMAL LOW (ref 150–400)
RBC: 3.73 MIL/uL — AB (ref 4.22–5.81)
RDW: 17.6 % — ABNORMAL HIGH (ref 11.5–15.5)
WBC: 6.4 10*3/uL (ref 4.0–10.5)

## 2014-09-28 LAB — MAGNESIUM: MAGNESIUM: 2.3 mg/dL (ref 1.7–2.4)

## 2014-09-28 MED ORDER — FUROSEMIDE 10 MG/ML IJ SOLN
80.0000 mg | Freq: Once | INTRAMUSCULAR | Status: AC
  Administered 2014-09-28: 80 mg via INTRAVENOUS
  Filled 2014-09-28: qty 8

## 2014-09-28 MED ORDER — SODIUM CHLORIDE 0.9 % IV SOLN
125.0000 mg | Freq: Once | INTRAVENOUS | Status: AC
  Administered 2014-09-28: 125 mg via INTRAVENOUS
  Filled 2014-09-28: qty 10

## 2014-09-28 MED ORDER — FUROSEMIDE 10 MG/ML IJ SOLN: 40.0000 mg | Freq: Two times a day (BID) | INTRAMUSCULAR | Status: DC

## 2014-09-28 NOTE — Progress Notes (Signed)
[facility] PROGRESS NOTE Subjective patient had bowel movement. Passing gas denies abdominal pain.  Objective: Vital signs in last 24 hours: Temp:  [97.6 F (36.4 C)-98.3 F (36.8 C)] 98.3 F (36.8 C) (08/21 0543) Pulse Rate:  [60-92] 60 (08/21 0543) Resp:  [18] 18 (08/21 0543) BP: (131-149)/(55-66) 131/55 mmHg (08/21 0543) SpO2:  [94 %-99 %] 95 % (08/21 0543) Weight:  [73.3 kg (161 lb 9.6 oz)] 73.3 kg (161 lb 9.6 oz) (08/21 0543)    Intake/Output from previous day: 08/20 0701 - 08/21 0700 In: 2325 [P.O.:530; I.V.:1295; IV Piggyback:500] Out: 1350 [Urine:1350] Intake/Output this shift:    PE: General-- alert no distress  Lungs-- clear Abdomen-- nondistended soft and nontender with good bowel sounds  Lab Results:  Recent Labs  09/27/14 0500 09/28/14 0400  WBC 9.3 6.4  HGB 9.0* 9.3*  HCT 29.5* 32.0*  PLT 129* 147*   BMET  Recent Labs  09/27/14 0500 09/28/14 0400  NA 143 146*  K 2.9* 3.6  CL 107 113*  CO2 26 23  CREATININE 2.21* 1.91*   LFT  Recent Labs  09/28/14 0400  PROT 4.8*  AST 101*  ALT 155*  ALKPHOS 177*  BILITOT 1.0   PT/INR No results for input(s): LABPROT, INR in the last 72 hours. PANCREAS No results for input(s): LIPASE in the last 72 hours.       Studies/Results: Dg Chest Port 1 View  09/27/2014   CLINICAL DATA:  81 year old with current history of hypertension, heart failure, COPD and stage IV chronic kidney disease who was admitted with abdominal pain and fever possibly due to recent passage of a bile duct stone. He also complains of acute onset of shortness of breath earlier today.  EXAM: PORTABLE CHEST - 1 VIEW  COMPARISON:  09/26/2014 and earlier.  FINDINGS: Since yesterday's examination, interval development of mild diffuse interstitial pulmonary edema and possible small bilateral pleural effusions. No confluent airspace consolidation. Cardiac silhouette moderately enlarged, unchanged. Left subclavian pacing  defibrillator unchanged and appears intact.  IMPRESSION: Acute CHF with mild diffuse interstitial pulmonary edema and probable small bilateral pleural effusions, new since yesterday.   Electronically Signed   By: Hulan Saas M.D.   On: 09/27/2014 20:55    Medications: I have reviewed the patient's current medications.  Assessment/Plan: 1. Acute pancreatitis. Unquestionably due to passage of gallstone. Total bilirubin normal, no dilated ducts on CT but a calcification. WBC normal transaminases markedly improved. Doubt that he has a impacted stone given this scenario will keep on antibiotics, check lipase in the morning and will likely need EUS prior to discharge to rule out CBD stone. If labs continue to improve, may be able to try clear liquids in the morning.   Mykale Gandolfo JR,Annebelle Bostic L 09/28/2014, 9:29 AM  Pager: [phone] If no answer or after hours call [phone]

## 2014-09-28 NOTE — Clinical Documentation Improvement (Signed)
Internal Medicine  Abnormal Lab/Test Results:  CXR 09/27/2014  Possible Clinical Conditions associated with below indicators  Acute/Chronic Diastolic CHF  Acute Diastolic CHF  Acute Systolic CHF  Acute /Chronic Systolic CHF  Other Condition  Cannot Clinically Determine  Supporting Information: IMPRESSION: Acute CHF with mild diffuse interstitial pulmonary edema and probable small bilateral pleural effusions, new since yesterday.   Treatment Provided: furosemide (LASIX) 80 MG tablet [[id number]]        Order Details      Dose: 80 mg Route: Oral Frequency: 2 times daily     Please exercise your independent, professional judgment when responding. A specific answer is not anticipated or expected.  Thank You, Nevin Bloodgood, RN, BSN, CCDS,Clinical Documentation Specialist:  [phone]  [phone]=[facility]- Health Information Management

## 2014-09-28 NOTE — Progress Notes (Signed)
PROGRESS NOTE  Brandon Floyd HUD:[id number] DOB: [birth year] DOA: 09/27/2014 PCP: Brandon Dress, MD   Brief history 81 year old male with a history of CKD stage IV, left-sided nephrectomy for renal cell carcinoma, hypertension, COPD, cardiomyopathy, and hyperlipidemia presented to [facility] ED with 24 hours of upper abdominal pain that was worse with meals. CT of the abdomen and pelvis at [facility] revealed small calcified structures of the head of the pancreas which may possibly be in the distal common bile duct near the ampulla. There was also a tiny amount of pneumobilia. The patient had cholecystectomy over 10 years ago. He was noted to have WBC 15.5, AST 628, ALT 405, alk phosphatase 294, total bilirubin 2.8, lipase 3927. However, the pancreas did not have any inflammatory changes on CT. Because of concerns of cholangitis and choledocholithiasis, the patient was transferred to [facility] for possible ERCP. [facility] has been consulted. Assessment/Plan: Acute pancreatitis/Transaminasemia/Hyperbilirubinemia -Likely due to recent passage of biliary stone  -pt s/p cholecystectomy as noted on CT abdomen at [facility] - Brandon Floyd -Daughter states that he had biliary pancreatitis approximately 15-20 years ago when he had his GB removed -case was discussed with GI, Dr. [name]  -Dr. [name] spoke with radiology who felt felt that possible explanation for the large calcifications which are estimated to be about a centimeter in size are: calcifications in the head of the pancreas, pill fragments trapped in a duodenal diverticulum, or large CBD/PD stone -He did not feel pt needed urgent ERCP at this time as pt is clinically stable and likelihood of CBD/PD stone was low given lack of ductal dilatation on CT -Remain nothing by mouth except sips with meds -Continue IV fluids judiciously  -Plan for EUS Monday per Dr. [name] -Continue Unasyn -Check lipids--Brandon Floyd was 129 Hypertension  -Continue metoprolol  succinate  Acute on Chronic diastolic CHF  -85/88/5027 echo EF 74-12%, grade 1 diastolic dysfunction  -s/p PPM--unclear reason per medical record and pt cannot clarify -daughter describes bradycardia/ some type of heart block as reason for PPM -Secondary to fluid resuscitation -CXR--small pleural effusions with mild increased interstitial markings -daily weights -Saline lock IV fluids -Start Furosemide 80 mg IV 1, then 40 mg bid -stable on RA--95% CKD stage IV  -Baseline creatinine 2.2-2.4  -Continue to monitor closely  Hyperlipidemia  -Continue statin  Hypokalemia  -Replete  -Check magnesium--2.3 Hyperglycemia  -Check hemoglobin A1c--pending  Iron deficiency anemia/anemia of chronic disease -Check serum B12--1799 -RBC folate--pending -iron studies--iron saturation 4%, ferritin 65 -Give a dose of IV iron Deconditioning/generalized weakness -PT evaluation -Patient usually ambulatory with walker -TSH--2.037 -Urinalysis--neg for pyuria   Family Communication: Daughters updated at beside 8/21 Disposition Plan: Home 1-2 days   Procedures/Studies: Dg Chest Port 1 View  09/27/2014   CLINICAL DATA:  81 year old with current history of hypertension, heart failure, COPD and stage IV chronic kidney disease who was admitted with abdominal pain and fever possibly due to recent passage of a bile duct stone. He also complains of acute onset of shortness of breath earlier today.  EXAM: PORTABLE CHEST - 1 VIEW  COMPARISON:  09/26/2014 and earlier.  FINDINGS: Since yesterday's examination, interval development of mild diffuse interstitial pulmonary edema and possible small bilateral pleural effusions. No confluent airspace consolidation. Cardiac silhouette moderately enlarged, unchanged. Left subclavian pacing defibrillator unchanged and appears intact.  IMPRESSION: Acute CHF with mild diffuse interstitial pulmonary edema and probable small bilateral pleural effusions, new since  yesterday.   Electronically Signed   By:  Evangeline Dakin M.D.   On: 09/27/2014 20:55         Subjective: Patient states that his shortness of breath has improved. He denies any fevers, chills, chest pain, nausea, vomiting, diarrhea, coughing, hemoptysis, hematochezia, melena. There is no dysuria or hematuria. No rashes. No headaches.  Objective: Filed Vitals:   09/27/14 0636 09/27/14 1400 09/27/14 2134 09/28/14 0543  BP: 136/51 138/66 149/60 131/55  Pulse: 60 92 61 60  Temp: 98 F (36.7 C) 98.1 F (36.7 C) 97.6 F (36.4 C) 98.3 F (36.8 C)  TempSrc: Axillary Axillary Axillary Oral  Resp: 18 18 18 18   Height:      Weight:    73.3 kg (161 lb 9.6 oz)  SpO2: 99% 99% 94% 95%    Intake/Output Summary (Last 24 hours) at 09/28/14 1656 Last data filed at 09/28/14 0548  Gross per 24 hour  Intake   1340 ml  Output    950 ml  Net    390 ml   Weight change: 3.3 kg (7 lb 4.4 oz) Exam:   General:  Pt is alert, follows commands appropriately, not in acute distress  HEENT: No icterus, No thrush, No neck mass, [place]/AT  Cardiovascular: RRR, S1/S2, no rubs, no gallops  Respiratory: Fine bibasilar crackles. No wheezing. Good air movement.  Abdomen: Soft/+BS, non tender, non distended, no guarding; no hepatosplenomegaly  Extremities: No edema, No lymphangitis, No petechiae, No rashes, no synovitis; no cyanosis or clubbing  Data Reviewed: Basic Metabolic Panel:  Recent Labs Lab 09/27/14 0500 09/28/14 0400  NA 143 146*  K 2.9* 3.6  CL 107 113*  CO2 26 23  GLUCOSE 94 67  BUN 57* 45*  CREATININE 2.21* 1.91*  CALCIUM 8.9 8.8*  MG  --  2.3   Liver Function Tests:  Recent Labs Lab 09/28/14 0400  AST 101*  ALT 155*  ALKPHOS 177*  BILITOT 1.0  PROT 4.8*  ALBUMIN 2.4*   No results for input(s): LIPASE, AMYLASE in the last 168 hours. No results for input(s): AMMONIA in the last 168 hours. CBC:  Recent Labs Lab 09/27/14 0500 09/28/14 0400  WBC 9.3 6.4  HGB 9.0*  9.3*  HCT 29.5* 32.0*  MCV 84.0 85.8  PLT 129* 147*   Cardiac Enzymes: No results for input(s): CKTOTAL, CKMB, CKMBINDEX, TROPONINI in the last 168 hours. BNP: Invalid input(s): POCBNP CBG: No results for input(s): GLUCAP in the last 168 hours.  Recent Results (from the past 240 hour(s))  Urine culture     Status: None   Collection Time: 09/27/14  2:15 PM  Result Value Ref Range Status   Specimen Description URINE, RANDOM  Final   Special Requests NONE  Final   Culture NO GROWTH 1 DAY  Final   Report Status 09/28/2014 FINAL  Final     Scheduled Meds: . ampicillin-sulbactam (UNASYN) IV  1.5 g Intravenous Q12H  . antiseptic oral rinse  7 mL Mouth Rinse q12n4p  . chlorhexidine  15 mL Mouth Rinse BID  . famotidine  20 mg Oral Daily  . [START ON 09/29/2014] furosemide  40 mg Intravenous BID  . furosemide  80 mg Intravenous Once  . metoprolol succinate  50 mg Oral Daily  . simvastatin  20 mg Oral q1800   Continuous Infusions:    Harlene Petralia, DO  [facility] Pager [phone]  If 7PM-7AM, please contact night-coverage [email or web address] Password [email or web address] 09/28/2014, 4:56 PM   LOS: 1 day

## 2014-09-28 NOTE — Progress Notes (Signed)
Utilization review completed.  

## 2014-09-29 ENCOUNTER — Ambulatory Visit (HOSPITAL_COMMUNITY): Payer: Medicare Other

## 2014-09-29 LAB — COMPREHENSIVE METABOLIC PANEL
ALT: 122 U/L — AB (ref 17–63)
ANION GAP: 17 — AB (ref 5–15)
AST: 54 U/L — ABNORMAL HIGH (ref 15–41)
Albumin: 2.8 g/dL — ABNORMAL LOW (ref 3.5–5.0)
Alkaline Phosphatase: 189 U/L — ABNORMAL HIGH (ref 38–126)
BUN: 37 mg/dL — ABNORMAL HIGH (ref 6–20)
CHLORIDE: 110 mmol/L (ref 101–111)
CO2: 20 mmol/L — AB (ref 22–32)
CREATININE: 1.83 mg/dL — AB (ref 0.61–1.24)
Calcium: 9.3 mg/dL (ref 8.9–10.3)
GFR calc non Af Amer: 32 mL/min — ABNORMAL LOW (ref >60)
GFR, EST AFRICAN AMERICAN: 37 mL/min — AB (ref >60)
Glucose, Bld: 53 mg/dL — ABNORMAL LOW (ref 65–99)
POTASSIUM: 3.6 mmol/L (ref 3.5–5.1)
SODIUM: 147 mmol/L — AB (ref 135–145)
Total Bilirubin: 1.3 mg/dL — ABNORMAL HIGH (ref 0.3–1.2)
Total Protein: 5.5 g/dL — ABNORMAL LOW (ref 6.5–8.1)

## 2014-09-29 LAB — HEMOGLOBIN A1C
Hgb A1c MFr Bld: 5.4 % (ref 4.8–5.6)
Mean Plasma Glucose: 108 mg/dL

## 2014-09-29 LAB — FOLATE RBC
FOLATE, HEMOLYSATE: 484 ng/mL
Folate, RBC: 1566 ng/mL (ref >498)
Hematocrit: 30.9 % — ABNORMAL LOW (ref 37.5–51.0)

## 2014-09-29 LAB — LIPASE, BLOOD: LIPASE: 919 U/L — AB (ref 22–51)

## 2014-09-29 MED ORDER — FUROSEMIDE 10 MG/ML IJ SOLN
80.0000 mg | Freq: Two times a day (BID) | INTRAMUSCULAR | Status: DC
  Administered 2014-09-29 – 2014-09-30 (×3): 80 mg via INTRAVENOUS
  Filled 2014-09-29 (×3): qty 8

## 2014-09-29 NOTE — Progress Notes (Signed)
[facility] PROGRESS NOTE Subjective Pt denies any abd pain says he is hungry!  Objective: Vital signs in last 24 hours: Temp:  [98.2 F (36.8 C)-98.4 F (36.9 C)] 98.2 F (36.8 C) (08/22 1610) Pulse Rate:  [57-62] 62 (08/22 0632) Resp:  [16] 16 (08/22 9604) BP: (148-153)/(53-54) 148/54 mmHg (08/22 0632) SpO2:  [93 %-94 %] 93 % (08/22 5409)    Intake/Output from previous day: 08/21 0701 - 08/22 0700 In: -  Out: 1400 [Urine:1400] Intake/Output this shift:    PE: General--alert no distrss  Lungs--clear Abdomen--soft, nontender  Lab Results:  Recent Labs  09/27/14 0500 09/28/14 0400  WBC 9.3 6.4  HGB 9.0* 9.3*  HCT 29.5* 32.0*  PLT 129* 147*   BMET  Recent Labs  09/27/14 0500 09/28/14 0400 09/29/14 0317  NA 143 146* 147*  K 2.9* 3.6 3.6  CL 107 113* 110  CO2 26 23 20*  CREATININE 2.21* 1.91* 1.83*   LFT  Recent Labs  09/28/14 0400 09/29/14 0317  PROT 4.8* 5.5*  AST 101* 54*  ALT 155* 122*  ALKPHOS 177* 189*  BILITOT 1.0 1.3*   PT/INR No results for input(s): LABPROT, INR in the last 72 hours. PANCREAS  Recent Labs  09/29/14 0317  LIPASE 919*         Studies/Results: Dg Chest Port 1 View  09/27/2014   CLINICAL DATA:  81 year old with current history of hypertension, heart failure, COPD and stage IV chronic kidney disease who was admitted with abdominal pain and fever possibly due to recent passage of a bile duct stone. He also complains of acute onset of shortness of breath earlier today.  EXAM: PORTABLE CHEST - 1 VIEW  COMPARISON:  09/26/2014 and earlier.  FINDINGS: Since yesterday's examination, interval development of mild diffuse interstitial pulmonary edema and possible small bilateral pleural effusions. No confluent airspace consolidation. Cardiac silhouette moderately enlarged, unchanged. Left subclavian pacing defibrillator unchanged and appears intact.  IMPRESSION: Acute CHF with mild diffuse interstitial pulmonary  edema and probable small bilateral pleural effusions, new since yesterday.   Electronically Signed   By: Hulan Saas M.D.   On: 09/27/2014 20:55    Medications: I have reviewed the patient's current medications.  Assessment/Plan: 1. Acute Pancreatitis. ? From GSs, with large calcification around ampulla on CT but no dilated ducts and fairly normal LFTs. Lipase still elevated, 900 down from 4000. Reviewed CT with radiology, no dilated ducts and pancreas not inflamed. Pt without any symptoms. Very unusual. Would continue support, NPO AB etc and ? Repeat CT vs EUS next few days. Avoid invasive ERCP unless needed.   Sofie Schendel JR,Jamila Slatten L 09/29/2014, 7:55 AM  Pager: [phone] If no answer or after hours call [phone]  2

## 2014-09-29 NOTE — Progress Notes (Signed)
PROGRESS NOTE  Brandon Floyd CHE:[id number] DOB: 05-22-1925 DOA: 09/27/2014 PCP: [name], MD Brief history 81 year old male with a history of CKD stage IV, left-sided nephrectomy for renal cell carcinoma, hypertension, COPD, cardiomyopathy, and hyperlipidemia presented to [facility] ED with 24 hours of upper abdominal pain that was worse with meals. CT of the abdomen and pelvis at [facility] revealed small calcified structures of the head of the pancreas which may possibly be in the distal common bile duct near the ampulla. There was also a tiny amount of pneumobilia. The patient had cholecystectomy over 10 years ago. He was noted to have WBC 15.5, AST 628, ALT 405, alk phosphatase 294, total bilirubin 2.8, lipase 3927. However, the pancreas did not have any inflammatory changes on CT. Because of concerns of cholangitis and choledocholithiasis, the patient was transferred to [facility] for possible ERCP. [facility] has been consulted. Assessment/Plan: Acute pancreatitis/Transaminasemia/Hyperbilirubinemia -Likely due to recent passage of biliary stone  -pt s/p cholecystectomy as noted on CT abdomen at [facility] -Daughter states that he had biliary pancreatitis approximately 15-20 years ago when he had his GB removed -case was discussed with GI, Dr. [name]  -Dr. [name] spoke with radiology who felt felt that possible explanation for the large calcifications which are estimated to be about a centimeter in size are: calcifications in the head of the pancreas, pill fragments trapped in a duodenal diverticulum, or large CBD/PD stone -He did not feel pt needed urgent ERCP at this time as pt is clinically stable and likelihood of CBD/PD stone was low given lack of ductal dilatation on CT -Remain nothing by mouth except sips with meds-->allow ice chips -Plan for EUS per Dr. [name] -Continue Unasyn -Check lipids--LDL was 129 Hypertension  -Continue metoprolol succinate  Acute on Chronic  diastolic CHF  -53/61/4431 echo EF 54-00%, grade 1 diastolic dysfunction  -s/p PPM--unclear reason per medical record and pt cannot clarify -daughter describes bradycardia/ some type of heart block as reason for PPM -Secondary to fluid resuscitation -CXR--small pleural effusions with mild increased interstitial markings -daily weights -Saline lock IV fluids -continue IV furosemide -stable on RA--95% Hypoglycemia -Due to poor oral intake, npo status -Check CBGs every 6 CKD stage IV  -Baseline creatinine 2.2-2.4  -Continue to monitor closely  Hyperlipidemia  -Continue statin  Hypokalemia  -Replete  -Check magnesium--2.3  Iron deficiency anemia/anemia of chronic disease -Check serum B12--1799 -RBC folate--pending -iron studies--iron saturation 4%, ferritin 65 -Given a dose of IV iron Deconditioning/generalized weakness -PT evaluation -Patient usually ambulatory with walker -TSH--2.037 -Urinalysis--neg for pyuria   Family Communication: Daughters updated at beside 8/22 Disposition Plan: Home 1-2 days    Procedures/Studies: Dg Chest Port 1 View  09/27/2014   CLINICAL DATA:  80 year old with current history of hypertension, heart failure, COPD and stage IV chronic kidney disease who was admitted with abdominal pain and fever possibly due to recent passage of a bile duct stone. He also complains of acute onset of shortness of breath earlier today.  EXAM: PORTABLE CHEST - 1 VIEW  COMPARISON:  09/26/2014 and earlier.  FINDINGS: Since yesterday's examination, interval development of mild diffuse interstitial pulmonary edema and possible small bilateral pleural effusions. No confluent airspace consolidation. Cardiac silhouette moderately enlarged, unchanged. Left subclavian pacing defibrillator unchanged and appears intact.  IMPRESSION: Acute CHF with mild diffuse interstitial pulmonary edema and probable small bilateral pleural effusions, new since yesterday.    Electronically Signed   By: Sherran Needs.D.  On: 09/27/2014 20:55        Subjective:   Objective: Filed Vitals:   09/27/14 2134 09/28/14 0543 09/28/14 2214 09/29/14 0632  BP: 149/60 131/55 153/53 148/54  Pulse: 61 60 57 62  Temp: 97.6 F (36.4 C) 98.3 F (36.8 C) 98.4 F (36.9 C) 98.2 F (36.8 C)  TempSrc: Axillary Oral Oral   Resp: 18 18 16 16   Height:      Weight:  73.3 kg (161 lb 9.6 oz)    SpO2: 94% 95% 94% 93%    Intake/Output Summary (Last 24 hours) at 09/29/14 1613 Last data filed at 09/29/14 0649  Gross per 24 hour  Intake      0 ml  Output   1400 ml  Net  -1400 ml   Weight change:  Exam:   General:  Pt is alert, follows commands appropriately, not in acute distress  HEENT: No icterus, No thrush, No neck mass, [place]/AT  Cardiovascular: RRR, S1/S2, no rubs, no gallops  Respiratory: CTA bilaterally, no wheezing, no crackles, no rhonchi  Abdomen: Soft/+BS, non tender, non distended, no guarding  Extremities: No edema, No lymphangitis, No petechiae, No rashes, no synovitis  Data Reviewed: Basic Metabolic Panel:  Recent Labs Lab 09/27/14 0500 09/28/14 0400 09/29/14 0317  NA 143 146* 147*  K 2.9* 3.6 3.6  CL 107 113* 110  CO2 26 23 20*  GLUCOSE 94 67 53*  BUN 57* 45* 37*  CREATININE 2.21* 1.91* 1.83*  CALCIUM 8.9 8.8* 9.3  MG  --  2.3  --    Liver Function Tests:  Recent Labs Lab 09/28/14 0400 09/29/14 0317  AST 101* 54*  ALT 155* 122*  ALKPHOS 177* 189*  BILITOT 1.0 1.3*  PROT 4.8* 5.5*  ALBUMIN 2.4* 2.8*    Recent Labs Lab 09/29/14 0317  LIPASE 919*   No results for input(s): AMMONIA in the last 168 hours. CBC:  Recent Labs Lab 09/27/14 0500 09/27/14 1345 09/28/14 0400  WBC 9.3  --  6.4  HGB 9.0*  --  9.3*  HCT 29.5* 30.9* 32.0*  MCV 84.0  --  85.8  PLT 129*  --  147*   Cardiac Enzymes: No results for input(s): CKTOTAL, CKMB, CKMBINDEX, TROPONINI in the last 168 hours. BNP: Invalid input(s):  POCBNP CBG: No results for input(s): GLUCAP in the last 168 hours.  Recent Results (from the past 240 hour(s))  Urine culture     Status: None   Collection Time: 09/27/14  2:15 PM  Result Value Ref Range Status   Specimen Description URINE, RANDOM  Final   Special Requests NONE  Final   Culture NO GROWTH 1 DAY  Final   Report Status 09/28/2014 FINAL  Final     Scheduled Meds: . ampicillin-sulbactam (UNASYN) IV  1.5 g Intravenous Q12H  . antiseptic oral rinse  7 mL Mouth Rinse q12n4p  . chlorhexidine  15 mL Mouth Rinse BID  . famotidine  20 mg Oral Daily  . furosemide  80 mg Intravenous BID  . metoprolol succinate  50 mg Oral Daily  . simvastatin  20 mg Oral q1800   Continuous Infusions:    Stephany Poorman, DO  [facility] Pager [phone]  If 7PM-7AM, please contact night-coverage [email or web address] Password [facility] 09/29/2014, 4:13 PM   LOS: 2 days

## 2014-09-29 NOTE — Progress Notes (Signed)
Inpatient Diabetes Program Recommendations  AACE/ADA: New Consensus Statement on Inpatient Glycemic Control (2013)  Target Ranges:  Prepandial:   less than 140 mg/dL      Peak postprandial:   less than 180 mg/dL (1-2 hours)      Critically ill patients:  140 - 180 mg/dL   Results for BRUCE, CHURILLA (MRN [id number]) as of 09/29/2014 09:36  Ref. Range 07/28/2008 04:42 09/27/2014 05:00 09/28/2014 04:00 09/29/2014 03:17  Glucose Latest Ref Range: 65-99 mg/dL 664 (H) 94 67 53 (L)    Diabetes history: No Outpatient Diabetes medications: NA Current orders for Inpatient glycemic control: None  Inpatient Diabetes Program Recommendations IV fluids: Lab glucose 53 mg/dl this morning at 4:03KV. If patient will remain NPO, may want to consider ordering IV fluids with dextrose if appropriate.  Thanks, Orlando Penner, RN, MSN, CCRN, CDE Diabetes Coordinator Inpatient Diabetes Program [phone] (Team Pager from 8am to 5pm) [phone] ([facility] office) [phone] [facility] [place] office) [phone] [facility] office)

## 2014-09-29 NOTE — Progress Notes (Signed)
PROGRESS NOTE  Brandon Floyd OOI:[id number] DOB: 10-Nov-1925 DOA: 09/27/2014 PCP: [name], MD Brief history 81 year old male with a history of CKD stage IV, left-sided nephrectomy for renal cell carcinoma, hypertension, COPD, cardiomyopathy, and hyperlipidemia presented to [facility] ED with 24 hours of upper abdominal pain that was worse with meals. CT of the abdomen and pelvis at [facility] revealed small calcified structures of the head of the pancreas which may possibly be in the distal common bile duct near the ampulla. There was also a tiny amount of pneumobilia. The patient had cholecystectomy over 10 years ago. He was noted to have WBC 15.5, AST 628, ALT 405, alk phosphatase 294, total bilirubin 2.8, lipase 3927. However, the pancreas did not have any inflammatory changes on CT. Because of concerns of cholangitis and choledocholithiasis, the patient was transferred to [facility] for possible ERCP. [facility] has been consulted. Assessment/Plan: Acute pancreatitis/Transaminasemia/Hyperbilirubinemia -Likely due to recent passage of biliary stone  -pt s/p cholecystectomy as noted on CT abdomen at [facility] -Daughter states that he had biliary pancreatitis approximately 15-20 years ago when he had his GB removed -case was discussed with GI, Dr. [name]  -Dr. [name] spoke with radiology who felt felt that possible explanation for the large calcifications which are estimated to be about a centimeter in size are: calcifications in the head of the pancreas, pill fragments trapped in a duodenal diverticulum, or large CBD/PD stone -He did not feel pt needed urgent ERCP at this time as pt is clinically stable and likelihood of CBD/PD stone was low given lack of ductal dilatation on CT -Remain nothing by mouth except sips with meds-->allow ice chips -Plan for EUS per Dr. [name] -Continue Unasyn -Check lipids--LDL was 129 Hypertension  -Continue metoprolol succinate  Acute on Chronic  diastolic CHF  -60/15/6153 echo EF 79-43%, grade 1 diastolic dysfunction  -s/p PPM--unclear reason per medical record and pt cannot clarify -daughter describes bradycardia/ some type of heart block as reason for PPM -Secondary to fluid resuscitation -CXR--small pleural effusions with mild increased interstitial markings -daily weights -Saline lock IV fluids -continue IV furosemide -stable on RA--95% CKD stage IV  -Baseline creatinine 2.2-2.4  -Continue to monitor closely  Hyperlipidemia  -Continue statin  Hypokalemia  -Replete  -Check magnesium--2.3  Iron deficiency anemia/anemia of chronic disease -Check serum B12--1799 -RBC folate--pending -iron studies--iron saturation 4%, ferritin 65 -Given a dose of IV iron Deconditioning/generalized weakness -PT evaluation -Patient usually ambulatory with walker -TSH--2.037 -Urinalysis--neg for pyuria   Family Communication: Daughters updated at beside 8/22 Disposition Plan: Home 1-2 days    Procedures/Studies: Dg Chest Port 1 View  09/27/2014   CLINICAL DATA:  81 year old with current history of hypertension, heart failure, COPD and stage IV chronic kidney disease who was admitted with abdominal pain and fever possibly due to recent passage of a bile duct stone. He also complains of acute onset of shortness of breath earlier today.  EXAM: PORTABLE CHEST - 1 VIEW  COMPARISON:  09/26/2014 and earlier.  FINDINGS: Since yesterday's examination, interval development of mild diffuse interstitial pulmonary edema and possible small bilateral pleural effusions. No confluent airspace consolidation. Cardiac silhouette moderately enlarged, unchanged. Left subclavian pacing defibrillator unchanged and appears intact.  IMPRESSION: Acute CHF with mild diffuse interstitial pulmonary edema and probable small bilateral pleural effusions, new since yesterday.   Electronically Signed   By: Evangeline Dakin M.D.   On: 09/27/2014 20:55  Subjective:   Objective: Filed Vitals:   09/27/14 2134 09/28/14 0543 09/28/14 2214 09/29/14 0632  BP: 149/60 131/55 153/53 148/54  Pulse: 61 60 57 62  Temp: 97.6 F (36.4 C) 98.3 F (36.8 C) 98.4 F (36.9 C) 98.2 F (36.8 C)  TempSrc: Axillary Oral Oral   Resp: 18 18 16 16   Height:      Weight:  73.3 kg (161 lb 9.6 oz)    SpO2: 94% 95% 94% 93%    Intake/Output Summary (Last 24 hours) at 09/29/14 1608 Last data filed at 09/29/14 0649  Gross per 24 hour  Intake      0 ml  Output   1400 ml  Net  -1400 ml   Weight change:  Exam:   General:  Pt is alert, follows commands appropriately, not in acute distress  HEENT: No icterus, No thrush, No neck mass, [place]/AT  Cardiovascular: RRR, S1/S2, no rubs, no gallops  Respiratory: CTA bilaterally, no wheezing, no crackles, no rhonchi  Abdomen: Soft/+BS, non tender, non distended, no guarding  Extremities: No edema, No lymphangitis, No petechiae, No rashes, no synovitis  Data Reviewed: Basic Metabolic Panel:  Recent Labs Lab 09/27/14 0500 09/28/14 0400 09/29/14 0317  NA 143 146* 147*  K 2.9* 3.6 3.6  CL 107 113* 110  CO2 26 23 20*  GLUCOSE 94 67 53*  BUN 57* 45* 37*  CREATININE 2.21* 1.91* 1.83*  CALCIUM 8.9 8.8* 9.3  MG  --  2.3  --    Liver Function Tests:  Recent Labs Lab 09/28/14 0400 09/29/14 0317  AST 101* 54*  ALT 155* 122*  ALKPHOS 177* 189*  BILITOT 1.0 1.3*  PROT 4.8* 5.5*  ALBUMIN 2.4* 2.8*    Recent Labs Lab 09/29/14 0317  LIPASE 919*   No results for input(s): AMMONIA in the last 168 hours. CBC:  Recent Labs Lab 09/27/14 0500 09/27/14 1345 09/28/14 0400  WBC 9.3  --  6.4  HGB 9.0*  --  9.3*  HCT 29.5* 30.9* 32.0*  MCV 84.0  --  85.8  PLT 129*  --  147*   Cardiac Enzymes: No results for input(s): CKTOTAL, CKMB, CKMBINDEX, TROPONINI in the last 168 hours. BNP: Invalid input(s): POCBNP CBG: No results for input(s): GLUCAP in the last 168 hours.  Recent  Results (from the past 240 hour(s))  Urine culture     Status: None   Collection Time: 09/27/14  2:15 PM  Result Value Ref Range Status   Specimen Description URINE, RANDOM  Final   Special Requests NONE  Final   Culture NO GROWTH 1 DAY  Final   Report Status 09/28/2014 FINAL  Final     Scheduled Meds: . ampicillin-sulbactam (UNASYN) IV  1.5 g Intravenous Q12H  . antiseptic oral rinse  7 mL Mouth Rinse q12n4p  . chlorhexidine  15 mL Mouth Rinse BID  . famotidine  20 mg Oral Daily  . furosemide  80 mg Intravenous BID  . metoprolol succinate  50 mg Oral Daily  . simvastatin  20 mg Oral q1800   Continuous Infusions:    Lyndall Windt, DO  [facility] Pager [phone]  If 7PM-7AM, please contact night-coverage [email or web address] Password [email or web address] 09/29/2014, 4:08 PM   LOS: 2 days

## 2014-09-30 ENCOUNTER — Inpatient Hospital Stay (HOSPITAL_COMMUNITY): Payer: Medicare Other

## 2014-09-30 DIAGNOSIS — I509 Heart failure, unspecified: Secondary | ICD-10-CM

## 2014-09-30 LAB — CBC WITH DIFFERENTIAL/PLATELET
BASOS ABS: 0.1 10*3/uL (ref 0.0–0.1)
Basophils Relative: 1 % (ref 0–1)
Eosinophils Absolute: 0.3 10*3/uL (ref 0.0–0.7)
Eosinophils Relative: 5 % (ref 0–5)
HEMATOCRIT: 35.6 % — AB (ref 39.0–52.0)
HEMOGLOBIN: 10.5 g/dL — AB (ref 13.0–17.0)
LYMPHS ABS: 0.9 10*3/uL (ref 0.7–4.0)
LYMPHS PCT: 15 % (ref 12–46)
MCH: 25 pg — AB (ref 26.0–34.0)
MCHC: 29.5 g/dL — ABNORMAL LOW (ref 30.0–36.0)
MCV: 84.8 fL (ref 78.0–100.0)
Monocytes Absolute: 0.6 10*3/uL (ref 0.1–1.0)
Monocytes Relative: 10 % (ref 3–12)
NEUTROS ABS: 4 10*3/uL (ref 1.7–7.7)
NEUTROS PCT: 69 % (ref 43–77)
Platelets: 165 10*3/uL (ref 150–400)
RBC: 4.2 MIL/uL — AB (ref 4.22–5.81)
RDW: 16.9 % — ABNORMAL HIGH (ref 11.5–15.5)
WBC: 5.7 10*3/uL (ref 4.0–10.5)

## 2014-09-30 LAB — COMPREHENSIVE METABOLIC PANEL
ALBUMIN: 2.9 g/dL — AB (ref 3.5–5.0)
ALT: 87 U/L — AB (ref 17–63)
AST: 31 U/L (ref 15–41)
Alkaline Phosphatase: 170 U/L — ABNORMAL HIGH (ref 38–126)
Anion gap: 14 (ref 5–15)
BILIRUBIN TOTAL: 1.6 mg/dL — AB (ref 0.3–1.2)
BUN: 36 mg/dL — AB (ref 6–20)
CHLORIDE: 107 mmol/L (ref 101–111)
CO2: 23 mmol/L (ref 22–32)
CREATININE: 2.09 mg/dL — AB (ref 0.61–1.24)
Calcium: 9.1 mg/dL (ref 8.9–10.3)
GFR calc Af Amer: 31 mL/min — ABNORMAL LOW (ref >60)
GFR, EST NON AFRICAN AMERICAN: 27 mL/min — AB (ref >60)
GLUCOSE: 63 mg/dL — AB (ref 65–99)
POTASSIUM: 3.2 mmol/L — AB (ref 3.5–5.1)
Sodium: 144 mmol/L (ref 135–145)
TOTAL PROTEIN: 5.8 g/dL — AB (ref 6.5–8.1)

## 2014-09-30 LAB — LIPASE, BLOOD: Lipase: 80 U/L — ABNORMAL HIGH (ref 22–51)

## 2014-09-30 LAB — GLUCOSE, CAPILLARY
GLUCOSE-CAPILLARY: 117 mg/dL — AB (ref 65–99)
GLUCOSE-CAPILLARY: 160 mg/dL — AB (ref 65–99)
GLUCOSE-CAPILLARY: 64 mg/dL — AB (ref 65–99)
GLUCOSE-CAPILLARY: 65 mg/dL (ref 65–99)
GLUCOSE-CAPILLARY: 84 mg/dL (ref 65–99)

## 2014-09-30 MED ORDER — DEXTROSE 50 % IV SOLN: 25.0000 mL | Freq: Once | INTRAVENOUS | Status: AC

## 2014-09-30 MED ORDER — POTASSIUM CHLORIDE CRYS ER 20 MEQ PO TBCR
40.0000 meq | EXTENDED_RELEASE_TABLET | Freq: Once | ORAL | Status: AC
  Administered 2014-09-30: 40 meq via ORAL
  Filled 2014-09-30: qty 2

## 2014-09-30 MED ORDER — DEXTROSE 50 % IV SOLN
INTRAVENOUS | Status: AC
  Administered 2014-09-30: 50 mL
  Filled 2014-09-30: qty 50

## 2014-09-30 MED ORDER — FUROSEMIDE 80 MG PO TABS
80.0000 mg | ORAL_TABLET | Freq: Two times a day (BID) | ORAL | Status: DC
  Administered 2014-10-01 – 2014-10-05 (×8): 80 mg via ORAL
  Filled 2014-09-30 (×8): qty 1

## 2014-09-30 NOTE — Progress Notes (Signed)
[facility] PROGRESS NOTE Subjective patient denies abdominal pain. States that he is hungry.  Objective: Vital signs in last 24 hours: Temp:  [97.9 F (36.6 C)-98.2 F (36.8 C)] 97.9 F (36.6 C) (08/23 0520) Pulse Rate:  [59-60] 60 (08/23 0520) Resp:  [17-18] 18 (08/23 0520) BP: (119-144)/(60-62) 119/60 mmHg (08/23 0520) SpO2:  [95 %-98 %] 95 % (08/23 0520) Weight:  [67.6 kg (149 lb 0.5 oz)] 67.6 kg (149 lb 0.5 oz) (08/23 0500) Last BM Date: 09/28/14  Intake/Output from previous day: 08/22 0701 - 08/23 0700 In: -  Out: 1000 [Urine:300; Stool:700] Intake/Output this shift:    PE: General-- alert and oriented, watching television  Lungs-- clear Abdomen-- soft, nontender with good bowel sounds.  Lab Results:  Recent Labs  09/27/14 1345 09/28/14 0400 09/30/14 0433  WBC  --  6.4 5.7  HGB  --  9.3* 10.5*  HCT 30.9* 32.0* 35.6*  PLT  --  147* 165   BMET  Recent Labs  09/28/14 0400 09/29/14 0317 09/30/14 0433  NA 146* 147* 144  K 3.6 3.6 3.2*  CL 113* 110 107  CO2 23 20* 23  CREATININE 1.91* 1.83* 2.09*   LFT  Recent Labs  09/28/14 0400 09/29/14 0317 09/30/14 0433  PROT 4.8* 5.5* 5.8*  AST 101* 54* 31  ALT 155* 122* 87*  ALKPHOS 177* 189* 170*  BILITOT 1.0 1.3* 1.6*   PT/INR No results for input(s): LABPROT, INR in the last 72 hours. PANCREAS  Recent Labs  09/29/14 0317 09/30/14 0433  LIPASE 919* 80*         Studies/Results: No results found.  Medications: I have reviewed the patient's current medications.  Assessment/Plan: 1. Acute pancreatitis. Clinically much improved. Calcification on CT scan large, radiology questions could be pill fragmented diverticulum. No dilated ducts. Liver test stable lipase continues to improve. We'll go ahead and give patient low-fat clear liquids and follow him clinically. If there any symptoms he may need EUS/ERCP this admission. If he continues to improve we could probably arrange  outpatient EUS depending on his clinical course.   Jai Steil JR,Oluchi Pucci L 09/30/2014, 7:57 AM  Pager: [phone] If no answer or after hours call [phone]

## 2014-09-30 NOTE — Progress Notes (Signed)
PROGRESS NOTE  Brandon Floyd FVC:[id number] DOB: 1925/10/29 DOA: 09/27/2014 PCP: [name], MD   Brief history 81 year old male with a history of CKD stage IV, left-sided nephrectomy for renal cell carcinoma, hypertension, COPD, cardiomyopathy, and hyperlipidemia presented to [facility] ED with 24 hours of upper abdominal pain that was worse with meals. CT of the abdomen and pelvis at [facility] revealed small calcified structures of the head of the pancreas which may possibly be in the distal common bile duct near the ampulla. There was also a tiny amount of pneumobilia. The patient had cholecystectomy over 10 years ago. He was noted to have WBC 15.5, AST 628, ALT 405, alk phosphatase 294, total bilirubin 2.8, lipase 3927. However, the pancreas did not have any inflammatory changes on CT. Because of concerns of cholangitis and choledocholithiasis, the patient was transferred to [facility] for possible ERCP. [facility] has been consulted-->conservative tx for now. Assessment/Plan: Acute pancreatitis/Transaminasemia/Hyperbilirubinemia -Likely due to recent passage of biliary stone  -pt s/p cholecystectomy as noted on CT abdomen at [facility] -Daughter states that he had biliary pancreatitis approximately 15-20 years ago when he had his GB removed -case was discussed with GI, Dr. [name]  -Dr. [name] spoke with radiology who felt felt that possible explanation for the large calcifications which are estimated to be about a centimeter in size are: calcifications in the head of the pancreas, pill fragments trapped in a duodenal diverticulum, or large CBD/PD stone -He did not feel pt needed urgent ERCP at this time as pt is clinically stable and likelihood of CBD/PD stone was low given lack of ductal dilatation on CT -09/30/2014--clear liquid diet -Plan for EUS only if no improvement per Dr. Edwards-->09/30/14--start clears -Continue Unasyn -Check lipids--LDL was 129 Hypertension  -Continue  metoprolol succinate  Acute on Chronic diastolic CHF  -63/84/6659 echo EF 93-57%, grade 1 diastolic dysfunction  -s/p PPM--unclear reason per medical record and pt cannot clarify -daughter describes bradycardia/ some type of heart block as reason for PPM -Secondary to fluid resuscitation -CXR--small pleural effusions with mild increased interstitial markings -daily weights--negative 12 lbs -Saline lock IV fluids -IV furosemide--switch back to home dose oral furosemide on  8/24 -stable on RA--99% Hypoglycemia--mild -Due to poor oral intake, npo status -Check CBGs every 6- -will NOT start D5W in setting of CHF exacerbation -give intermitten D50 vs juice Chronic pain -Patient frequently describes "pain all over" -Takes Percocet 10/325 every 6 hours at home -Continue intravenous morphine until able to tolerate po -overall abdominal pain has improved CKD stage IV  -Baseline creatinine 2.1-2.4  -Continue to monitor closely  Hyperlipidemia  -Continue statin  Hypokalemia  -Replete  -Check magnesium--2.3  Iron deficiency anemia/anemia of chronic disease -Check serum B12--1799 -RBC folate--WNL -iron studies--iron saturation 4%, ferritin 65 -Given a dose of IV iron -oral iron at the time of discharge Deconditioning/generalized weakness -PT evaluation -Patient usually ambulatory with walker -TSH--2.037 -Urinalysis--neg for pyuria   Family Communication: Daughters updated at beside 8/22 Disposition Plan: Home 1-2 days   Procedures/Studies: Dg Chest Port 1 View  09/27/2014   CLINICAL DATA:  81 year old with current history of hypertension, heart failure, COPD and stage IV chronic kidney disease who was admitted with abdominal pain and fever possibly due to recent passage of a bile duct stone. He also complains of acute onset of shortness of breath earlier today.  EXAM: PORTABLE CHEST - 1 VIEW  COMPARISON:  09/26/2014 and earlier.  FINDINGS: Since yesterday's  examination,  interval development of mild diffuse interstitial pulmonary edema and possible small bilateral pleural effusions. No confluent airspace consolidation. Cardiac silhouette moderately enlarged, unchanged. Left subclavian pacing defibrillator unchanged and appears intact.  IMPRESSION: Acute CHF with mild diffuse interstitial pulmonary edema and probable small bilateral pleural effusions, new since yesterday.   Electronically Signed   By: Evangeline Dakin M.D.   On: 09/27/2014 20:55         Subjective: patient complains pain all over. Denies any fevers, chills, chest pain, shortness breath, nausea, vomiting, diarrhea, abdominal pain. No dysuria or hematuria. He has some mild dyspnea on exertion.   Objective: Filed Vitals:   09/29/14 2112 09/30/14 0500 09/30/14 0520 09/30/14 1351  BP: 144/62  119/60 135/62  Pulse: 59  60 50  Temp: 98.2 F (36.8 C)  97.9 F (36.6 C) 97.5 F (36.4 C)  TempSrc: Oral   Oral  Resp: 17  18 18   Height:      Weight:  67.6 kg (149 lb 0.5 oz)    SpO2: 98%  95% 99%    Intake/Output Summary (Last 24 hours) at 09/30/14 1910 Last data filed at 09/30/14 1749  Gross per 24 hour  Intake    250 ml  Output    700 ml  Net   -450 ml   Weight change:  Exam:   General:  Pt is alert, follows commands appropriately, not in acute distress  HEENT: No icterus, No thrush, No neck mass, [place]/AT  Cardiovascular: RRR, S1/S2, no rubs, no gallops  Respiratory: CTA bilaterally, no wheezing, no crackles, no rhonchi  Abdomen: Soft/+BS, non tender, non distended, no guarding  Extremities: No edema, No lymphangitis, No petechiae, No rashes, no synovitis  Data Reviewed: Basic Metabolic Panel:  Recent Labs Lab 09/27/14 0500 09/28/14 0400 09/29/14 0317 09/30/14 0433  NA 143 146* 147* 144  K 2.9* 3.6 3.6 3.2*  CL 107 113* 110 107  CO2 26 23 20* 23  GLUCOSE 94 67 53* 63*  BUN 57* 45* 37* 36*  CREATININE 2.21* 1.91* 1.83* 2.09*  CALCIUM 8.9 8.8* 9.3 9.1   MG  --  2.3  --   --    Liver Function Tests:  Recent Labs Lab 09/28/14 0400 09/29/14 0317 09/30/14 0433  AST 101* 54* 31  ALT 155* 122* 87*  ALKPHOS 177* 189* 170*  BILITOT 1.0 1.3* 1.6*  PROT 4.8* 5.5* 5.8*  ALBUMIN 2.4* 2.8* 2.9*    Recent Labs Lab 09/29/14 0317 09/30/14 0433  LIPASE 919* 80*   No results for input(s): AMMONIA in the last 168 hours. CBC:  Recent Labs Lab 09/27/14 0500 09/27/14 1345 09/28/14 0400 09/30/14 0433  WBC 9.3  --  6.4 5.7  NEUTROABS  --   --   --  4.0  HGB 9.0*  --  9.3* 10.5*  HCT 29.5* 30.9* 32.0* 35.6*  MCV 84.0  --  85.8 84.8  PLT 129*  --  147* 165   Cardiac Enzymes: No results for input(s): CKTOTAL, CKMB, CKMBINDEX, TROPONINI in the last 168 hours. BNP: Invalid input(s): POCBNP CBG:  Recent Labs Lab 09/30/14 0009 09/30/14 0528 09/30/14 0622 09/30/14 1115 09/30/14 1704  GLUCAP 64* 65 160* 84 117*    Recent Results (from the past 240 hour(s))  Urine culture     Status: None   Collection Time: 09/27/14  2:15 PM  Result Value Ref Range Status   Specimen Description URINE, RANDOM  Final   Special Requests NONE  Final   Culture NO  GROWTH 1 DAY  Final   Report Status 09/28/2014 FINAL  Final     Scheduled Meds: . ampicillin-sulbactam (UNASYN) IV  1.5 g Intravenous Q12H  . antiseptic oral rinse  7 mL Mouth Rinse q12n4p  . chlorhexidine  15 mL Mouth Rinse BID  . famotidine  20 mg Oral Daily  . [START ON 10/01/2014] furosemide  80 mg Oral BID  . metoprolol succinate  50 mg Oral Daily  . simvastatin  20 mg Oral q1800   Continuous Infusions:    Meleni Delahunt, DO  [facility] Pager [phone]  If 7PM-7AM, please contact night-coverage [email or web address] Password [email or web address] 09/30/2014, 7:10 PM   LOS: 3 days

## 2014-09-30 NOTE — Progress Notes (Signed)
Pt stated that he doesn't like to tell the doctors that he is hurting and that most of the time he can deal with the pain. This morning patient was holding his abdomen, groaning in pain, grimacing,and stated his pain was a 10 out of 10. Pt has needed to be medicated twice for abdominal pain this morning and pt's daughters are concerned that his pain is increasing. Pt even when sleeping has been moaning.

## 2014-09-30 NOTE — Progress Notes (Signed)
Hypoglycemic Event  CBG: 63  Treatment: D50 IV 25 mL  Symptoms: Pale  Follow-up CBG: Time:0630 CBG Result:160  Possible Reasons for Event: Inadequate meal intake  Comments/MD notified:yes    Nalda Shackleford, Jenetta Loges  Remember to initiate Hypoglycemia Order Set & complete

## 2014-09-30 NOTE — Progress Notes (Signed)
  Echocardiogram 2D Echocardiogram has been performed.  Delcie Roch 09/30/2014, 8:42 AM

## 2014-10-01 DIAGNOSIS — E876 Hypokalemia: Secondary | ICD-10-CM

## 2014-10-01 DIAGNOSIS — I5033 Acute on chronic diastolic (congestive) heart failure: Secondary | ICD-10-CM

## 2014-10-01 DIAGNOSIS — N184 Chronic kidney disease, stage 4 (severe): Secondary | ICD-10-CM

## 2014-10-01 DIAGNOSIS — D509 Iron deficiency anemia, unspecified: Secondary | ICD-10-CM

## 2014-10-01 DIAGNOSIS — K859 Acute pancreatitis, unspecified: Secondary | ICD-10-CM

## 2014-10-01 DIAGNOSIS — K8020 Calculus of gallbladder without cholecystitis without obstruction: Secondary | ICD-10-CM

## 2014-10-01 LAB — COMPREHENSIVE METABOLIC PANEL
ALBUMIN: 2.8 g/dL — AB (ref 3.5–5.0)
ALK PHOS: 152 U/L — AB (ref 38–126)
ALT: 65 U/L — AB (ref 17–63)
ALT: 63 U/L (ref 17–63)
AST: 24 U/L (ref 15–41)
AST: 27 U/L (ref 15–41)
Albumin: 2.8 g/dL — ABNORMAL LOW (ref 3.5–5.0)
Alkaline Phosphatase: 157 U/L — ABNORMAL HIGH (ref 38–126)
Anion gap: 11 (ref 5–15)
Anion gap: 11 (ref 5–15)
BUN: 30 mg/dL — AB (ref 6–20)
BUN: 32 mg/dL — ABNORMAL HIGH (ref 6–20)
CALCIUM: 9.2 mg/dL (ref 8.9–10.3)
CHLORIDE: 103 mmol/L (ref 101–111)
CHLORIDE: 101 mmol/L (ref 101–111)
CO2: 27 mmol/L (ref 22–32)
CO2: 27 mmol/L (ref 22–32)
CREATININE: 1.8 mg/dL — AB (ref 0.61–1.24)
Calcium: 9.2 mg/dL (ref 8.9–10.3)
Creatinine, Ser: 1.83 mg/dL — ABNORMAL HIGH (ref 0.61–1.24)
GFR calc Af Amer: 38 mL/min — ABNORMAL LOW (ref >60)
GFR calc non Af Amer: 32 mL/min — ABNORMAL LOW (ref >60)
GFR, EST AFRICAN AMERICAN: 37 mL/min — AB (ref >60)
GFR, EST NON AFRICAN AMERICAN: 32 mL/min — AB (ref >60)
GLUCOSE: 95 mg/dL (ref 65–99)
Glucose, Bld: 96 mg/dL (ref 65–99)
POTASSIUM: 3.5 mmol/L (ref 3.5–5.1)
Potassium: 3.5 mmol/L (ref 3.5–5.1)
SODIUM: 141 mmol/L (ref 135–145)
SODIUM: 139 mmol/L (ref 135–145)
Total Bilirubin: 1.2 mg/dL (ref 0.3–1.2)
Total Bilirubin: 1.3 mg/dL — ABNORMAL HIGH (ref 0.3–1.2)
Total Protein: 5.4 g/dL — ABNORMAL LOW (ref 6.5–8.1)
Total Protein: 5.4 g/dL — ABNORMAL LOW (ref 6.5–8.1)

## 2014-10-01 LAB — GLUCOSE, CAPILLARY
GLUCOSE-CAPILLARY: 145 mg/dL — AB (ref 65–99)
GLUCOSE-CAPILLARY: 91 mg/dL (ref 65–99)
GLUCOSE-CAPILLARY: 94 mg/dL (ref 65–99)
Glucose-Capillary: 100 mg/dL — ABNORMAL HIGH (ref 65–99)
Glucose-Capillary: 118 mg/dL — ABNORMAL HIGH (ref 65–99)

## 2014-10-01 LAB — MAGNESIUM: Magnesium: 2 mg/dL (ref 1.7–2.4)

## 2014-10-01 LAB — LIPASE, BLOOD: LIPASE: 115 U/L — AB (ref 22–51)

## 2014-10-01 MED ORDER — POTASSIUM CHLORIDE CRYS ER 20 MEQ PO TBCR
40.0000 meq | EXTENDED_RELEASE_TABLET | Freq: Once | ORAL | Status: AC
  Administered 2014-10-01: 40 meq via ORAL
  Filled 2014-10-01: qty 2

## 2014-10-01 NOTE — Care Management Important Message (Signed)
Important Message  Patient Details  Name: Brandon Floyd MRN: [id number] Date of Birth: [birth year]   Medicare Important Message Given:  Yes-second notification given    Orson Aloe 10/01/2014, 10:57 AM

## 2014-10-01 NOTE — Progress Notes (Signed)
[facility] PROGRESS NOTE Subjective Feels ok no pain tolerated clears.  Objective: Vital signs in last 24 hours: Temp:  [97.5 F (36.4 C)-98 F (36.7 C)] 98 F (36.7 C) (08/23 2113) Pulse Rate:  [50-62] 59 (08/24 0905) Resp:  [16-18] 16 (08/23 2113) BP: (124-143)/(55-67) 143/67 mmHg (08/24 0905) SpO2:  [97 %-99 %] 97 % (08/23 2113) Weight:  [68.2 kg (150 lb 5.7 oz)] 68.2 kg (150 lb 5.7 oz) (08/24 0500) Last BM Date: 09/28/14  Intake/Output from previous day: 08/23 0701 - 08/24 0700 In: 370 [P.O.:370] Out: 650 [Urine:650] Intake/Output this shift: Total I/O In: 150 [P.O.:150] Out: -   PE: General--alert no distress  Abdomen--soft, nontender  Lab Results:  Recent Labs  09/30/14 0433  WBC 5.7  HGB 10.5*  HCT 35.6*  PLT 165   BMET  Recent Labs  09/29/14 0317 09/30/14 0433 09/30/14 2333 10/01/14 0320  NA 147* 144 141 139  K 3.6 3.2* 3.5 3.5  CL 110 107 103 101  CO2 20* CREATININE 1.83* 2.09* 1.83* 1.80*   LFT  Recent Labs  09/30/14 0433 09/30/14 2333 10/01/14 0320  PROT 5.8* 5.4* 5.4*  AST ALT 87* 65* 63  ALKPHOS 170* 157* 152*  BILITOT 1.6* 1.3* 1.2   PT/INR No results for input(s): LABPROT, INR in the last 72 hours. PANCREAS  Recent Labs  09/29/14 0317 09/30/14 0433 10/01/14 0320  LIPASE 919* 80* 115*         Studies/Results: No results found.  Medications: I have reviewed the patient's current medications.  Assessment/Plan: 1. Acute Pancreatitis. Pt has essentially normal LFTs and lipase near normal, CT doesn't show pancreatic inflammation or dilated ducts, and ? If the 1 cm calcification is a pill fragment.  Long discussion with 2 daughters. If tolerates low fat diet could be discharged. We will contact  Daughter, Matthew Folks at [phone] to set up elective OP EUS with Dr Dulce Sellar next week. Have discussed EUSand ERCP including the risks of pancreatitis with the family.   Anushka Hartinger JR,Jochebed Bills  L 10/01/2014, 11:05 AM  Pager: [phone] If no answer or after hours call [phone]

## 2014-10-01 NOTE — Evaluation (Signed)
Physical Therapy Evaluation Patient Details Name: Brandon Floyd MRN: [id number] DOB: 01/22/1926 Today's Date: 10/01/2014   History of Present Illness  81 yo male with onset of abd pain from pancreatitis, has acute CHF and fall recently  Clinical Impression  Pt was able to be seen for mobility with notable balance changes, LLE weaker than RLE and has been falling at home.  Plan for PT is to go to SNF but pt is not decided about this.  Encouraged him to look at SNF as a way to maintain better independence and have greater safety at home.    Follow Up Recommendations SNF    Equipment Recommendations  None recommended by PT    Recommendations for Other Services       Precautions / Restrictions Precautions Precautions: Fall Restrictions Weight Bearing Restrictions: No      Mobility  Bed Mobility Overal bed mobility: Needs Assistance Bed Mobility: Supine to Sit;Sit to Supine     Supine to sit: Min guard;Min assist Sit to supine: Min guard;Min assist   General bed mobility comments: reminders for hand placement and sequence  Transfers Overall transfer level: Needs assistance Equipment used: Rolling walker (2 wheeled);1 person hand held assist Transfers: Sit to/from [employer] Sit to Stand: Min guard;Min assist Stand pivot transfers: Min guard;Min assist       General transfer comment: reminder for hand placement and pt remembered after that  Ambulation/Gait Ambulation/Gait assistance: Min guard;Min assist Ambulation Distance (Feet): 12 Feet Assistive device: Rolling walker (2 wheeled);1 person hand held assist Gait Pattern/deviations: Step-through pattern;Decreased stride length;Wide base of support;Trunk flexed Gait velocity: slower Gait velocity interpretation: Below normal speed for age/gender General Gait Details: flexed standing posture with pt struggling to maneuver walker due to shoulder pain  Stairs            Wheelchair Mobility     Modified Rankin (Stroke Patients Only)       Balance Overall balance assessment: Needs assistance Sitting-balance support: Feet supported Sitting balance-Leahy Scale: Fair   Postural control: Posterior lean Standing balance support: Bilateral upper extremity supported Standing balance-Leahy Scale: Poor                               Pertinent Vitals/Pain Pain Assessment: Faces Pain Score: 6  Faces Pain Scale: Hurts even more Pain Location: shoulder blades and neck Pain Descriptors / Indicators: Aching;Cramping Pain Intervention(s): Limited activity within patient's tolerance;Monitored during session;Repositioned    Home Living Family/patient expects to be discharged to:: Private residence Living Arrangements: Children Available Help at Discharge: Family;Available 24 hours/day Type of Home: House Home Access: Ramped entrance     Home Layout: One level Home Equipment: Walker - 2 wheels;Bedside commode;Shower seat;Electric scooter      Prior Function Level of Independence: Independent with assistive device(s)         Comments: usually drives and goes out to breakfast and dinner with friends     Hand Dominance        Extremity/Trunk Assessment   Upper Extremity Assessment: Overall WFL for tasks assessed           Lower Extremity Assessment: LLE deficits/detail (RLE WFL)   LLE Deficits / Details: 4- hip and knee, ankle 4+  Cervical / Trunk Assessment: Kyphotic  Communication   Communication: HOH (hearing aids in)  Cognition Arousal/Alertness: Awake/alert Behavior During Therapy: WFL for tasks assessed/performed Overall Cognitive Status: Within Functional Limits for tasks assessed  General Comments General comments (skin integrity, edema, etc.): Pt is having a better time with PT by sitting and resting on chair half way through gait distance, and then more comfortable.  Has hoped to go directly home but asked  he and daughter to reconsider to SNF as he is dependent on help and has been falling.  He is somewhat open to the idea.    Exercises        Assessment/Plan    PT Assessment Patient needs continued PT services  PT Diagnosis Acute pain;Generalized weakness   PT Problem List Decreased strength;Decreased range of motion;Decreased activity tolerance;Decreased balance;Decreased mobility;Decreased coordination;Decreased knowledge of use of DME;Decreased safety awareness;Pain;Decreased skin integrity  PT Treatment Interventions DME instruction;Gait training;Functional mobility training;Therapeutic activities;Therapeutic exercise;Balance training;Neuromuscular re-education;Patient/family education   PT Goals (Current goals can be found in the Care Plan section) Acute Rehab PT Goals Patient Stated Goal: to go home and to hurt less on back PT Goal Formulation: With patient/family Time For Goal Achievement: 10/15/14 Potential to Achieve Goals: Good    Frequency Min 2X/week   Barriers to discharge Inaccessible home environment needs help for all mobility and has ramp to enter but also recent falls    Co-evaluation               End of Session   Activity Tolerance: Patient tolerated treatment well;Patient limited by fatigue;Patient limited by lethargy Patient left: in bed;with call bell/phone within reach;with bed alarm set;with family/visitor present Nurse Communication: Mobility status         Time: 2841-3244 PT Time Calculation (min) (ACUTE ONLY): 26 min   Charges:   PT Evaluation $Initial PT Evaluation Tier I: 1 Procedure PT Treatments $Gait Training: 8-22 mins   PT G CodesIvar Drape 2014-10-28, 12:25 PM   Samul Dada, PT MS Acute Rehab Dept. Number: [id number] and [facility] [phone]

## 2014-10-01 NOTE — Progress Notes (Signed)
[facility] PROGRESS NOTE  Brandon Floyd ALP:[id number] DOB: 23-Feb-1925 DOA: 09/27/2014 PCP: [name], MD  Brief history 81 year old male with a history of CKD stage IV, left-sided nephrectomy for renal cell carcinoma, hypertension, COPD, cardiomyopathy, and hyperlipidemia presented to [facility] ED with 24 hours of upper abdominal pain that was worse with meals. CT of the abdomen and pelvis at [facility] revealed small calcified structures of the head of the pancreas which may possibly be in the distal common bile duct near the ampulla. There was also a tiny amount of pneumobilia. The patient had cholecystectomy over 10 years ago. He was noted to have WBC 15.5, AST 628, ALT 405, alk phosphatase 294, total bilirubin 2.8, lipase 3927. However, the pancreas did not have any inflammatory changes on CT. Because of concerns of cholangitis and choledocholithiasis, the patient was transferred to [facility] for possible ERCP. [facility] has been consulted-->conservative tx for now.  Assessment/Plan: #1 acute pancreatitis/hyperbilirubinemia/transaminitis Likely secondary to a passed biliary stone. Patient with clinical improvement. Patient with prior history of cholecystectomy as noted per CT of the abdomen the [facility]. Patient daughter stated that patient had biliary pancreatitis approximately 15-20 years ago when he had his cholecystectomy. Patient has been seen in consultation by Dr. [name] of gastroenterology who feels that the possible explanation for large calcifications which estimated to be a centimeter in size are: Calcifications in the head of the pancreas, pedal fragments trapped in the duodenal diverticulum, or large CBD/PD stone. GI did not feel patient needed urgent ERCP at this time and patient was clinically stable. Patient has tolerated clear liquid diet which was started on 09/30/2014. Patient on IV Unasyn. Patient's diet has been advanced to a low-fat diet per GI. Patient to be  set up for outpatient EUS per Dr. [name] next week. GI following and appreciate input and recommendations.  #2 hypertension Stable. Continue metoprolol.  #3 acute on chronic diastolic CHF Likely secondary to fluid resuscitation. 2-D echo from 07/28/2008 with a EF of 55-60% with grade 1 diastolic dysfunction. Patient is status post PPM. Daughter describes bradycardia/some type of heart block as reason for PPM. Patient's IV fluids have been saline locked. Patient is -2.062 L during this hospitalization. Patient has been transitioned from IV Lasix to his home dose oral Lasix. Symptomatic improvement. Outpatient follow-up.  #4 mild hypoglycemia Secondary to nothing by mouth status and poor oral intake. Patient currently on a diet. Follow.  #5 chronic pain Continue current pain regimen. Resume patient's home regimen of Percocet.  #6 chronic kidney disease stage IV Baseline creatinine 2.1-2.4. Currently better than baseline. Follow.  #7 hyperlipidemia Continue statin.  #8 hypokalemia Replete.  #9 and deficiency anemia/anemia of chronic disease. B-12 levels of 1799. RBC folate within normal limits. Iron saturation was 4%. Ferritin of 65. Patient status post 1 dose of IV iron. Patient will need to be started on oral iron at time of discharge.  #10 deconditioning/generalized weakness Patient has been seen by PT. PT recommending skilled nursing facility. Patient hesitant to go to a skilled nursing facility and wants to go back home.  #11 prophylaxis SCDs for DVT prophylaxis.  Code Status: DO NOT RESUSCITATE Family Communication: Updated patient. Updated daughter via telephone. Disposition Plan: SNF versus home with home health hopefully 1-2 days.   Consultants:  Gastroenterologist: Dr. [name] 09/27/2014  Procedures:  Chest x-ray 09/27/2014  2-D echo 09/30/2014  Antibiotics:  IV Unasyn 09/27/2014  HPI/Subjective: Patient denies any abdominal pain. No nausea. No vomiting.  Patient tolerated current diet.  Objective: Filed Vitals:   10/01/14 1351  BP: 150/62  Pulse: 59  Temp: 98.8 F (37.1 C)  Resp: 18    Intake/Output Summary (Last 24 hours) at 10/01/14 1759 Last data filed at 10/01/14 1751  Gross per 24 hour  Intake    790 ml  Output    750 ml  Net     40 ml   Filed Weights   09/28/14 0543 09/30/14 0500 10/01/14 0500  Weight: 73.3 kg (161 lb 9.6 oz) 67.6 kg (149 lb 0.5 oz) 68.2 kg (150 lb 5.7 oz)    Exam:   General:  NAD  Cardiovascular: RRR  Respiratory: CTAB  Abdomen: Soft, nontender, nondistended, positive bowel sounds.  Musculoskeletal: No clubbing cyanosis or edema.  Data Reviewed: Basic Metabolic Panel:  Recent Labs Lab 09/28/14 0400 09/29/14 0317 09/30/14 0433 09/30/14 2333 10/01/14 0320  NA 146* 147* 144 141 139  K 3.6 3.6 3.2* 3.5 3.5  CL 113* 110 107 103 101  CO2 23 20* 23 27 27   GLUCOSE 67 53* 63* 96 95  BUN 45* 37* 36* 32* 30*  CREATININE 1.91* 1.83* 2.09* 1.83* 1.80*  CALCIUM 8.8* 9.3 9.1 9.2 9.2  MG 2.3  --   --   --  2.0   Liver Function Tests:  Recent Labs Lab 09/28/14 0400 09/29/14 0317 09/30/14 0433 09/30/14 2333 10/01/14 0320  AST 101* 54* 31 27 24   ALT 155* 122* 87* 65* 63  ALKPHOS 177* 189* 170* 157* 152*  BILITOT 1.0 1.3* 1.6* 1.3* 1.2  PROT 4.8* 5.5* 5.8* 5.4* 5.4*  ALBUMIN 2.4* 2.8* 2.9* 2.8* 2.8*    Recent Labs Lab 09/29/14 0317 09/30/14 0433 10/01/14 0320  LIPASE 919* 80* 115*   No results for input(s): AMMONIA in the last 168 hours. CBC:  Recent Labs Lab 09/27/14 0500 09/27/14 1345 09/28/14 0400 09/30/14 0433  WBC 9.3  --  6.4 5.7  NEUTROABS  --   --   --  4.0  HGB 9.0*  --  9.3* 10.5*  HCT 29.5* 30.9* 32.0* 35.6*  MCV 84.0  --  85.8 84.8  PLT 129*  --  147* 165   Cardiac Enzymes: No results for input(s): CKTOTAL, CKMB, CKMBINDEX, TROPONINI in the last 168 hours. BNP (last 3 results) No results for input(s): BNP in the last 8760 hours.  ProBNP (last 3  results) No results for input(s): PROBNP in the last 8760 hours.  CBG:  Recent Labs Lab 09/30/14 1704 10/01/14 0028 10/01/14 0411 10/01/14 1209 10/01/14 1737  GLUCAP 117* 94 91 100* 118*    Recent Results (from the past 240 hour(s))  Urine culture     Status: None   Collection Time: 09/27/14  2:15 PM  Result Value Ref Range Status   Specimen Description URINE, RANDOM  Final   Special Requests NONE  Final   Culture NO GROWTH 1 DAY  Final   Report Status 09/28/2014 FINAL  Final     Studies: No results found.  Scheduled Meds: . ampicillin-sulbactam (UNASYN) IV  1.5 g Intravenous Q12H  . antiseptic oral rinse  7 mL Mouth Rinse q12n4p  . chlorhexidine  15 mL Mouth Rinse BID  . famotidine  20 mg Oral Daily  . furosemide  80 mg Oral BID  . metoprolol succinate  50 mg Oral Daily  . simvastatin  20 mg Oral q1800   Continuous Infusions:   Active Problems:   Pancreatitis, gallstone   Pressure ulcer   Choledocholithiasis   Cholecystitis,  acute   Anemia, iron deficiency   Hyperglycemia   Chronic kidney disease (CKD), stage IV (severe)   Hypokalemia   Chronic diastolic CHF (congestive heart failure)   Pancreatitis, acute   Acute on chronic diastolic CHF (congestive heart failure)    Time spent: 3 minutes    Mally Gavina M.D. [facility] Pager [phone]. If 7PM-7AM, please contact night-coverage at [email or web address], password [facility] 10/01/2014, 5:59 PM  LOS: 4 days

## 2014-10-02 DIAGNOSIS — K810 Acute cholecystitis: Secondary | ICD-10-CM

## 2014-10-02 LAB — MAGNESIUM: MAGNESIUM: 2 mg/dL (ref 1.7–2.4)

## 2014-10-02 LAB — CBC
HEMATOCRIT: 36.3 % — AB (ref 39.0–52.0)
HEMOGLOBIN: 11.2 g/dL — AB (ref 13.0–17.0)
MCH: 25.2 pg — ABNORMAL LOW (ref 26.0–34.0)
MCHC: 30.9 g/dL (ref 30.0–36.0)
MCV: 81.8 fL (ref 78.0–100.0)
Platelets: 155 10*3/uL (ref 150–400)
RBC: 4.44 MIL/uL (ref 4.22–5.81)
RDW: 16.6 % — AB (ref 11.5–15.5)
WBC: 6.9 10*3/uL (ref 4.0–10.5)

## 2014-10-02 LAB — COMPREHENSIVE METABOLIC PANEL
ALT: 47 U/L (ref 17–63)
AST: 25 U/L (ref 15–41)
Albumin: 2.9 g/dL — ABNORMAL LOW (ref 3.5–5.0)
Alkaline Phosphatase: 144 U/L — ABNORMAL HIGH (ref 38–126)
Anion gap: 12 (ref 5–15)
BILIRUBIN TOTAL: 0.7 mg/dL (ref 0.3–1.2)
BUN: 35 mg/dL — AB (ref 6–20)
CHLORIDE: 101 mmol/L (ref 101–111)
CO2: 26 mmol/L (ref 22–32)
CREATININE: 1.77 mg/dL — AB (ref 0.61–1.24)
Calcium: 9.5 mg/dL (ref 8.9–10.3)
GFR, EST AFRICAN AMERICAN: 38 mL/min — AB (ref >60)
GFR, EST NON AFRICAN AMERICAN: 33 mL/min — AB (ref >60)
Glucose, Bld: 106 mg/dL — ABNORMAL HIGH (ref 65–99)
POTASSIUM: 3.9 mmol/L (ref 3.5–5.1)
Sodium: 139 mmol/L (ref 135–145)
TOTAL PROTEIN: 5.6 g/dL — AB (ref 6.5–8.1)

## 2014-10-02 LAB — LIPASE, BLOOD: LIPASE: 278 U/L — AB (ref 22–51)

## 2014-10-02 LAB — GLUCOSE, CAPILLARY
GLUCOSE-CAPILLARY: 109 mg/dL — AB (ref 65–99)
Glucose-Capillary: 135 mg/dL — ABNORMAL HIGH (ref 65–99)
Glucose-Capillary: 127 mg/dL — ABNORMAL HIGH (ref 65–99)
Glucose-Capillary: 130 mg/dL — ABNORMAL HIGH (ref 65–99)

## 2014-10-02 NOTE — Progress Notes (Signed)
[facility] PROGRESS NOTE  Brandon Floyd WUJ:[id number] DOB: May 24, 1925 DOA: 09/27/2014 PCP: [name], MD  Brief history 81 year old male with a history of CKD stage IV, left-sided nephrectomy for renal cell carcinoma, hypertension, COPD, cardiomyopathy, and hyperlipidemia presented to [facility] ED with 24 hours of upper abdominal pain that was worse with meals. CT of the abdomen and pelvis at [facility] revealed small calcified structures of the head of the pancreas which may possibly be in the distal common bile duct near the ampulla. There was also a tiny amount of pneumobilia. The patient had cholecystectomy over 10 years ago. He was noted to have WBC 15.5, AST 628, ALT 405, alk phosphatase 294, total bilirubin 2.8, lipase 3927. However, the pancreas did not have any inflammatory changes on CT. Because of concerns of cholangitis and choledocholithiasis, the patient was transferred to [facility] for possible ERCP. [facility] has been consulted-->conservative tx for now.  Assessment/Plan: #1 acute pancreatitis/hyperbilirubinemia/transaminitis Likely secondary to a passed biliary stone. Patient with clinical improvement. Patient with prior history of cholecystectomy as noted per CT of the abdomen the [facility]. Patient daughter stated that patient had biliary pancreatitis approximately 15-20 years ago when he had his cholecystectomy. Patient has been seen in consultation by Dr. [name] of gastroenterology who feels that the possible explanation for large calcifications which estimated to be a centimeter in size are: Calcifications in the head of the pancreas, pedal fragments trapped in the duodenal diverticulum, or large CBD/PD stone. GI did not feel patient needed urgent ERCP at this time and patient was clinically stable. Patient has tolerated clear liquid diet which was started on 09/30/2014. D/c IV Unasyn. Patient's diet has been advanced to a low-fat diet per GI which patient  tolerated.. Patient to be set up for outpatient EUS per Dr. [name] next week. GI following and appreciate input and recommendations.  #2 hypertension Stable. Continue metoprolol.  #3 acute on chronic diastolic CHF Likely secondary to fluid resuscitation. 2-D echo from 07/28/2008 with a EF of 55-60% with grade 1 diastolic dysfunction. Patient is status post PPM. Daughter describes bradycardia/some type of heart block as reason for PPM. Patient's IV fluids have been saline locked. Patient is -2.092 L during this hospitalization. Patient has been transitioned from IV Lasix to his home dose oral Lasix. Symptomatic improvement. Outpatient follow-up.  #4 mild hypoglycemia Secondary to nothing by mouth status and poor oral intake. Patient currently on a diet. Follow.  #5 chronic pain Continue current pain regimen. Resume patient's home regimen of Percocet.  #6 chronic kidney disease stage IV Baseline creatinine 2.1-2.4. Currently better than baseline. Follow.  #7 hyperlipidemia Continue statin.  #8 hypokalemia Replete.  #9 and deficiency anemia/anemia of chronic disease. B-12 levels of 1799. RBC folate within normal limits. Iron saturation was 4%. Ferritin of 65. Patient status post 1 dose of IV iron. Patient will need to be started on oral iron at time of discharge.  #10 deconditioning/generalized weakness Patient has been seen by PT. PT recommending skilled nursing facility. Patient hesitant to go to a skilled nursing facility and wants to go back home. Patient goes home we'll set up with home health.  #11 prophylaxis SCDs for DVT prophylaxis.  Code Status: DO NOT RESUSCITATE Family Communication: Updated patient. Updated daughter via telephone. Disposition Plan: SNF versus home with home health hopefully tomorrow.    Consultants:  Gastroenterologist: Dr. [name] 09/27/2014  Procedures:  Chest x-ray 09/27/2014  2-D echo 09/30/2014  Antibiotics:  IV Unasyn 09/27/2014>>>>>  10/02/2014  HPI/Subjective: Patient  denies any further abdominal pain. No nausea or emesis. Tolerating current diet.   Objective: Filed Vitals:   10/02/14 1338  BP: 128/67  Pulse: 60  Temp: 98.3 F (36.8 C)  Resp: 18    Intake/Output Summary (Last 24 hours) at 10/02/14 1546 Last data filed at 10/02/14 1428  Gross per 24 hour  Intake   1120 ml  Output    950 ml  Net    170 ml   Filed Weights   09/30/14 0500 10/01/14 0500 10/02/14 0320  Weight: 67.6 kg (149 lb 0.5 oz) 68.2 kg (150 lb 5.7 oz) 67.9 kg (149 lb 11.1 oz)    Exam:   General:  NAD  Cardiovascular: RRR  Respiratory: CTAB  Abdomen: Soft, nontender, nondistended, positive bowel sounds.  Musculoskeletal: No clubbing cyanosis or edema.  Data Reviewed: Basic Metabolic Panel:  Recent Labs Lab 09/28/14 0400 09/29/14 0317 09/30/14 0433 09/30/14 2333 10/01/14 0320 10/02/14 0350  NA 146* 147* 144 141 139 139  K 3.6 3.6 3.2* 3.5 3.5 3.9  CL 113* 110 107 103 101 101  CO2 23 20* 23 27 27 26   GLUCOSE 67 53* 63* 96 95 106*  BUN 45* 37* 36* 32* 30* 35*  CREATININE 1.91* 1.83* 2.09* 1.83* 1.80* 1.77*  CALCIUM 8.8* 9.3 9.1 9.2 9.2 9.5  MG 2.3  --   --   --  2.0 2.0   Liver Function Tests:  Recent Labs Lab 09/29/14 0317 09/30/14 0433 09/30/14 2333 10/01/14 0320 10/02/14 0350  AST 54* 31 27 24 25   ALT 122* 87* 65* 63 47  ALKPHOS 189* 170* 157* 152* 144*  BILITOT 1.3* 1.6* 1.3* 1.2 0.7  PROT 5.5* 5.8* 5.4* 5.4* 5.6*  ALBUMIN 2.8* 2.9* 2.8* 2.8* 2.9*    Recent Labs Lab 09/29/14 0317 09/30/14 0433 10/01/14 0320 10/02/14 0350  LIPASE 919* 80* 115* 278*   No results for input(s): AMMONIA in the last 168 hours. CBC:  Recent Labs Lab 09/27/14 0500 09/27/14 1345 09/28/14 0400 09/30/14 0433 10/02/14 0350  WBC 9.3  --  6.4 5.7 6.9  NEUTROABS  --   --   --  4.0  --   HGB 9.0*  --  9.3* 10.5* 11.2*  HCT 29.5* 30.9* 32.0* 35.6* 36.3*  MCV 84.0  --  85.8 84.8 81.8  PLT 129*  --  147* 165 155    Cardiac Enzymes: No results for input(s): CKTOTAL, CKMB, CKMBINDEX, TROPONINI in the last 168 hours. BNP (last 3 results) No results for input(s): BNP in the last 8760 hours.  ProBNP (last 3 results) No results for input(s): PROBNP in the last 8760 hours.  CBG:  Recent Labs Lab 10/01/14 1209 10/01/14 1737 10/01/14 2139 10/02/14 0723 10/02/14 1129  GLUCAP 100* 118* 145* 109* 135*    Recent Results (from the past 240 hour(s))  Urine culture     Status: None   Collection Time: 09/27/14  2:15 PM  Result Value Ref Range Status   Specimen Description URINE, RANDOM  Final   Special Requests NONE  Final   Culture NO GROWTH 1 DAY  Final   Report Status 09/28/2014 FINAL  Final     Studies: No results found.  Scheduled Meds: . antiseptic oral rinse  7 mL Mouth Rinse q12n4p  . chlorhexidine  15 mL Mouth Rinse BID  . famotidine  20 mg Oral Daily  . furosemide  80 mg Oral BID  . metoprolol succinate  50 mg Oral Daily  . simvastatin  20 mg Oral q1800   Continuous Infusions:   Active Problems:   Pancreatitis, gallstone   Pressure ulcer   Choledocholithiasis   Cholecystitis, acute   Anemia, iron deficiency   Hyperglycemia   Chronic kidney disease (CKD), stage IV (severe)   Hypokalemia   Chronic diastolic CHF (congestive heart failure)   Pancreatitis, acute   Acute on chronic diastolic CHF (congestive heart failure)    Time spent: 52 minutes    Catia Todorov M.D. [facility] Pager [phone]. If 7PM-7AM, please contact night-coverage at [email or web address], password [facility] 10/02/2014, 3:46 PM  LOS: 5 days

## 2014-10-02 NOTE — Progress Notes (Signed)
[facility] PROGRESS NOTE Subjective patient tolerated low-fat diet without any abdominal pain.  Objective: Vital signs in last 24 hours: Temp:  [98.1 F (36.7 C)-98.8 F (37.1 C)] 98.1 F (36.7 C) (08/25 0320) Pulse Rate:  [59-60] 60 (08/25 0320) Resp:  [18-19] 19 (08/25 0320) BP: (134-159)/(61-79) 134/79 mmHg (08/25 0320) SpO2:  [95 %-98 %] 95 % (08/25 0320) Weight:  [67.9 kg (149 lb 11.1 oz)] 67.9 kg (149 lb 11.1 oz) (08/25 0320) Last BM Date: 09/28/14  Intake/Output from previous day: 08/24 0701 - 08/25 0700 In: 790 [P.O.:790] Out: 900 [Urine:900] Intake/Output this shift:    PE: General-- no acute distress  Abdomen-- completely soft nontender with good bowel sounds.  Lab Results:  Recent Labs  09/30/14 0433 10/02/14 0350  WBC 5.7 6.9  HGB 10.5* 11.2*  HCT 35.6* 36.3*  PLT 165 155   BMET  Recent Labs  09/30/14 0433 09/30/14 2333 10/01/14 0320 10/02/14 0350  NA 144 141 139 139  K 3.2* 3.5 3.5 3.9  CL 107 103 101 101  CO2 CREATININE 2.09* 1.83* 1.80* 1.77*   LFT  Recent Labs  09/30/14 2333 10/01/14 0320 10/02/14 0350  PROT 5.4* 5.4* 5.6*  AST ALT 65* 63 47  ALKPHOS 157* 152* 144*  BILITOT 1.3* 1.2 0.7   PT/INR No results for input(s): LABPROT, INR in the last 72 hours. PANCREAS  Recent Labs  09/30/14 0433 10/01/14 0320 10/02/14 0350  LIPASE 80* 115* 278*         Studies/Results: No results found.  Medications: I have reviewed the patient's current medications.  Assessment/Plan: 1. Acute pancreatitis. Probably due to passage of gallstone. I have reviewed CT scan with radiology and there are calcifications in the head of the pancreas but no signs of acute pancreatitis on CT scan and more importantly, no dilated ducts. The patient was tolerating the diet without any symptoms. His lipase is bumped up that he appears to be asymptomatic. Unfortunately, MRCP is not possible due to pacemaker. At this  point I would stop his antibiotic and observe him overnight. If the continues to do well clinically he could be discharged home. We are working to get him scheduled for elective EUS by Dr. Dulce Sellar next week.   Khiree Bukhari JR,Akin Yi L 10/02/2014, 7:16 AM  Pager: [phone] If no answer or after hours call [phone]

## 2014-10-03 LAB — GLUCOSE, CAPILLARY
GLUCOSE-CAPILLARY: 135 mg/dL — AB (ref 65–99)
GLUCOSE-CAPILLARY: 83 mg/dL (ref 65–99)
Glucose-Capillary: 104 mg/dL — ABNORMAL HIGH (ref 65–99)
Glucose-Capillary: 96 mg/dL (ref 65–99)

## 2014-10-03 LAB — LIPASE, BLOOD: LIPASE: 588 U/L — AB (ref 22–51)

## 2014-10-03 LAB — CBC WITH DIFFERENTIAL/PLATELET
BASOS PCT: 1 % (ref 0–1)
Basophils Absolute: 0 10*3/uL (ref 0.0–0.1)
Eosinophils Absolute: 0.3 10*3/uL (ref 0.0–0.7)
Eosinophils Relative: 5 % (ref 0–5)
HEMATOCRIT: 33.3 % — AB (ref 39.0–52.0)
HEMOGLOBIN: 10.2 g/dL — AB (ref 13.0–17.0)
LYMPHS ABS: 1 10*3/uL (ref 0.7–4.0)
LYMPHS PCT: 16 % (ref 12–46)
MCH: 25.3 pg — AB (ref 26.0–34.0)
MCHC: 30.6 g/dL (ref 30.0–36.0)
MCV: 82.6 fL (ref 78.0–100.0)
MONO ABS: 0.4 10*3/uL (ref 0.1–1.0)
MONOS PCT: 7 % (ref 3–12)
NEUTROS ABS: 4.5 10*3/uL (ref 1.7–7.7)
NEUTROS PCT: 71 % (ref 43–77)
Platelets: 129 10*3/uL — ABNORMAL LOW (ref 150–400)
RBC: 4.03 MIL/uL — ABNORMAL LOW (ref 4.22–5.81)
RDW: 16.7 % — AB (ref 11.5–15.5)
WBC: 6.2 10*3/uL (ref 4.0–10.5)

## 2014-10-03 LAB — COMPREHENSIVE METABOLIC PANEL
ALBUMIN: 2.7 g/dL — AB (ref 3.5–5.0)
ALK PHOS: 132 U/L — AB (ref 38–126)
ALT: 39 U/L (ref 17–63)
ANION GAP: 12 (ref 5–15)
AST: 52 U/L — ABNORMAL HIGH (ref 15–41)
BUN: 37 mg/dL — ABNORMAL HIGH (ref 6–20)
CALCIUM: 9 mg/dL (ref 8.9–10.3)
CHLORIDE: 102 mmol/L (ref 101–111)
CO2: 26 mmol/L (ref 22–32)
Creatinine, Ser: 1.83 mg/dL — ABNORMAL HIGH (ref 0.61–1.24)
GFR calc non Af Amer: 32 mL/min — ABNORMAL LOW (ref >60)
GFR, EST AFRICAN AMERICAN: 37 mL/min — AB (ref >60)
GLUCOSE: 102 mg/dL — AB (ref 65–99)
Potassium: 5.4 mmol/L — ABNORMAL HIGH (ref 3.5–5.1)
SODIUM: 140 mmol/L (ref 135–145)
Total Bilirubin: 2 mg/dL — ABNORMAL HIGH (ref 0.3–1.2)
Total Protein: 5 g/dL — ABNORMAL LOW (ref 6.5–8.1)

## 2014-10-03 MED ORDER — SODIUM CHLORIDE 0.9 % IV SOLN
1.5000 g | INTRAVENOUS | Status: AC
  Administered 2014-10-04: 1.5 g via INTRAVENOUS
  Filled 2014-10-03: qty 1.5

## 2014-10-03 NOTE — Clinical Social Work Note (Signed)
Clinical Social Work Assessment  Patient Details  Name: Brandon Floyd MRN: [id number] Date of Birth: 1925-05-05  Date of referral:  10/03/14               Reason for consult:  Facility Placement, Discharge Planning                Permission sought to share information with:  Facility Medical sales representative, Family Supports Permission granted to share information::  Yes, Verbal Permission Granted  Name::     Brandon Floyd  Agency::  [facility]  Relationship::  Daughter  Contact Information:  [phone]  Housing/Transportation Living arrangements for the past 2 months:  Single Family Home Source of Information:  Adult Children Patient Interpreter Needed:  None Criminal Activity/Legal Involvement Pertinent to Current Situation/Hospitalization:  No - Comment as needed Significant Relationships:  Adult Children Lives with:  Adult Children Do you feel safe going back to the place where you live?  No (High fall risk.) Need for family participation in patient care:  Yes (Comment) (Patient's daughter active in patient's care.)  Care giving concerns:  Patient's daughter expressed no concerns at this time.   Social Worker assessment / plan:  CSW received referral for possible SNF placement at time of discharge. CSW spoke with patient's daughter regarding discharge disposition. Patient's daughter agreeable to SNF placement at time of discharge. Per patient's daughter, family would prefer SNF placement in [facility]. CSW to continue to follow and assist with discharge planning needs.  Employment status:  Retired Health and safety inspector:  Medicare PT Recommendations:  Skilled Nursing Facility Information / Referral to community resources:  Skilled Nursing Facility  Patient/Family's Response to care:  Patient's daughter understanding and agreeable to CSW plan of care.  Patient/Family's Understanding of and Emotional Response to Diagnosis, Current Treatment, and Prognosis:  Patient's  daughter understanding and agreeable to CSW plan of care.  Emotional Assessment Appearance:  Other (Comment Required (CSW spoke with patient's daughter at patient's request.) Attitude/Demeanor/Rapport:  Other (CSW spoke with patient's daughter at patient's request.) Affect (typically observed):  Other (CSW spoke with patient's daughter at patient's request.) Orientation:  Oriented to Self, Oriented to Place, Oriented to  Time, Oriented to Situation Alcohol / Substance use:  Not Applicable Psych involvement (Current and /or in the community):  No (Comment) (Not appropriate on this admission.)  Discharge Needs  Concerns to be addressed:  No discharge needs identified Readmission within the last 30 days:  No Current discharge risk:  None Barriers to Discharge:  No Barriers Identified   Rod Mae, LCSW 10/03/2014, 3:27 PM [phone]

## 2014-10-03 NOTE — Progress Notes (Signed)
Physical Therapy Treatment Patient Details Name: Brandon Floyd MRN: [id number] DOB: July 09, 1925 Today's Date: 10/03/2014    History of Present Illness 81 yo male with onset of abd pain from pancreatitis, has acute CHF and fall recently    PT Comments    Progressing with endurance and independence.  Agree home with HHPT better plan for patient at this time.  Follow Up Recommendations  Home health PT     Equipment Recommendations  None recommended by PT    Recommendations for Other Services       Precautions / Restrictions Precautions Precautions: Fall    Mobility  Bed Mobility         Supine to sit: Supervision;HOB elevated     General bed mobility comments: for safety coming up with increased time with rail  Transfers Overall transfer level: Needs assistance Equipment used: Rolling walker (2 wheeled) Transfers: Sit to/from Stand Sit to Stand: Min guard;Supervision         General transfer comment: assist for safety and cues for hand placement stand to sit  Ambulation/Gait Ambulation/Gait assistance: Supervision;Min guard Ambulation Distance (Feet): 90 Feet Assistive device: Rolling walker (2 wheeled) Gait Pattern/deviations: Step-through pattern;Decreased stride length     General Gait Details: mildly antalgic, pt reports knee giving away x 1 (no noted imbalance), cues for proximity to walker   Stairs            Wheelchair Mobility    Modified Rankin (Stroke Patients Only)       Balance Overall balance assessment: Needs assistance           Standing balance-Leahy Scale: Poor Standing balance comment: UE assist needed for balance in standing                    Cognition Arousal/Alertness: Awake/alert Behavior During Therapy: WFL for tasks assessed/performed Overall Cognitive Status: Within Functional Limits for tasks assessed                      Exercises General Exercises - Lower Extremity Ankle Circles/Pumps:  AROM;Both;10 reps;Seated Long Arc Quad: AROM;10 reps;Both;Seated (w/ 3 sec hold) Hip Flexion/Marching: AROM;Both;10 reps;Seated Shoulder Exercises Shoulder Flexion: AROM;5 reps;Both;Seated Neck Flexion: AROM;5 reps;Seated Neck Extension: AROM;5 reps;Seated Neck Lateral Flexion - Right: AROM;5 reps;Seated Neck Lateral Flexion - Left: AROM;Seated    General Comments        Pertinent Vitals/Pain Faces Pain Scale: Hurts even more Pain Location: generalized, mostly shoulders and neck Pain Descriptors / Indicators: Aching Pain Intervention(s): Monitored during session;Repositioned;Other (comment) (AROM)    Home Living                      Prior Function            PT Goals (current goals can now be found in the care plan section) Progress towards PT goals: Progressing toward goals    Frequency  Min 3X/week    PT Plan Discharge plan needs to be updated;Frequency needs to be updated    Co-evaluation             End of Session Equipment Utilized During Treatment: Gait belt Activity Tolerance: Patient tolerated treatment well Patient left: in chair;with call bell/phone within reach;with family/visitor present     Time: 1442-1510 PT Time Calculation (min) (ACUTE ONLY): 28 min  Charges:  $Gait Training: 8-22 mins $Therapeutic Exercise: 8-22 mins  G Codes:      Emiliya Chretien,CYNDI 10/03/2014, 3:42 PM  Sheran Lawless, PT [phone] 10/03/2014

## 2014-10-03 NOTE — Progress Notes (Signed)
[facility] PROGRESS NOTE Subjective Pt feels ok queasy last night  Objective: Vital signs in last 24 hours: Temp:  [98.1 F (36.7 C)-98.6 F (37 C)] 98.6 F (37 C) (08/26 0559) Pulse Rate:  [59-65] 65 (08/26 0559) Resp:  [18-19] 19 (08/26 0559) BP: (118-128)/(65-70) 118/69 mmHg (08/26 0559) SpO2:  [95 %-96 %] 95 % (08/26 0559) Weight:  [68.3 kg (150 lb 9.2 oz)] 68.3 kg (150 lb 9.2 oz) (08/26 0559) Last BM Date: 09/28/14  Intake/Output from previous day: 08/25 0701 - 08/26 0700 In: 840 [P.O.:840] Out: 350 [Urine:350] Intake/Output this shift:    PE: General--NAD  Abdomen--soft nontender  Lab Results:  Recent Labs  10/02/14 0350 10/03/14 0533  WBC 6.9 6.2  HGB 11.2* 10.2*  HCT 36.3* 33.3*  PLT 155 129*   BMET  Recent Labs  09/30/14 2333 10/01/14 0320 10/02/14 0350 10/03/14 0321  NA 141 139 139 140  K 3.5 3.5 3.9 5.4*  CL 103 101 101 102  CO2 CREATININE 1.83* 1.80* 1.77* 1.83*   LFT  Recent Labs  10/01/14 0320 10/02/14 0350 10/03/14 0321  PROT 5.4* 5.6* 5.0*  AST 24 25 52*  ALT 63 47 39  ALKPHOS 152* 144* 132*  BILITOT 1.2 0.7 2.0*   PT/INR No results for input(s): LABPROT, INR in the last 72 hours. PANCREAS  Recent Labs  10/01/14 0320 10/02/14 0350 10/03/14 0321  LIPASE 115* 278* 588*         Studies/Results: No results found.  Medications: I have reviewed the patient's current medications.  Assessment/Plan: 1. Acute Pancreatitis. Probable GS, calcifications on CT but no dilated ducts or pancreatic edema. After low fat diet lipase increased and so did LFTs. Original plan was to do EUS/ERCP as OP next week but feel we should keep in hospital and clear CBD before sending home. Will tenatively schedule tomorrow and discuss with daughter Ms Maryclare Bean.   Jadrian Bulman JR,Daesia Zylka L 10/03/2014, 7:50 AM  Pager: [phone] If no answer or after hours call [phone]

## 2014-10-03 NOTE — Progress Notes (Signed)
[facility] PROGRESS NOTE  Brandon Floyd HGD:[id number] DOB: 03/02/1925 DOA: 09/27/2014 PCP: [name], MD  Brief history 81 year old male with a history of CKD stage IV, left-sided nephrectomy for renal cell carcinoma, hypertension, COPD, cardiomyopathy, and hyperlipidemia presented to [facility] ED with 24 hours of upper abdominal pain that was worse with meals. CT of the abdomen and pelvis at [facility] revealed small calcified structures of the head of the pancreas which may possibly be in the distal common bile duct near the ampulla. There was also a tiny amount of pneumobilia. The patient had cholecystectomy over 10 years ago. He was noted to have WBC 15.5, AST 628, ALT 405, alk phosphatase 294, total bilirubin 2.8, lipase 3927. However, the pancreas did not have any inflammatory changes on CT. Because of concerns of cholangitis and choledocholithiasis, the patient was transferred to [facility] for possible ERCP. [facility] has been consulted-->conservative tx for now. Patient was improving clinically and was started on a diet. After patient was started on a low-fat diet patient did have some nausea however abdominal pain had improved. LFTs were noted to be elevated and a significant rise in lipase to 588 and as such patient's been scheduled for ERCP tomorrow 10/04/2014 per GI.  Assessment/Plan: #1 acute pancreatitis/hyperbilirubinemia/transaminitis Likely secondary to a passed biliary stone. Patient with clinical improvement. Patient with prior history of cholecystectomy as noted per CT of the abdomen the [facility]. Patient daughter stated that patient had biliary pancreatitis approximately 15-20 years ago when he had his cholecystectomy. Patient has been seen in consultation by Dr. [name] of gastroenterology who feels that the possible explanation for large calcifications which estimated to be a centimeter in size are: Calcifications in the head of the pancreas, pedal fragments  trapped in the duodenal diverticulum, or large CBD/PD stone. GI did not feel patient needed urgent ERCP at this time and patient was clinically stable. Patient has tolerated clear liquid diet which was started on 09/30/2014. D/c IV Unasyn. Patient's diet has been advanced to a low-fat diet per GI which patient tolerated.. Patient was some complaints of nausea overnight however abdominal pain has improved. Comprehensive metabolic profile with elevated LFTs and lipase levels have risen significantly to 588 from 278 yesterday from 115 the day before. Bilirubin is also elevated at 2. Due to significant elevation in lipase and concern for gallstone pancreatitis patient to undergo an ERCP tomorrow. GI to assess, bile duct prior to discharge. Per GI.  #2 hypertension Stable. Continue metoprolol.  #3 acute on chronic diastolic CHF Likely secondary to fluid resuscitation. 2-D echo from 07/28/2008 with a EF of 55-60% with grade 1 diastolic dysfunction. Patient is status post PPM. Daughter describes bradycardia/some type of heart block as reason for PPM. Patient's IV fluids have been saline locked. Patient is -1.852 L during this hospitalization. Continue home dose oral Lasix. Symptomatic improvement. Outpatient follow-up.  #4 mild hypoglycemia Secondary to nothing by mouth status and poor oral intake. Patient currently on a diet. Follow.  #5 chronic pain Continue current pain regimen. Resumed patient's home regimen of Percocet.  #6 chronic kidney disease stage IV Baseline creatinine 2.1-2.4. Currently better than baseline. Follow.  #7 hyperlipidemia Continue statin.  #8 hypokalemia Repleted.  #9 and deficiency anemia/anemia of chronic disease. B-12 levels of 1799. RBC folate within normal limits. Iron saturation was 4%. Ferritin of 65. Patient status post 1 dose of IV iron. Patient will need to be started on oral iron at time of discharge.  #10 deconditioning/generalized weakness Patient has been  seen by PT. PT recommending skilled nursing facility. Patient hesitant to go to a skilled nursing facility and wants to go back home. If Patient goes home will set up with home health.  #11 prophylaxis SCDs for DVT prophylaxis.  Code Status: DO NOT RESUSCITATE Family Communication: Updated patient and daughter at bedside. Disposition Plan: SNF versus home with home health pending ERCP.    Consultants:  Gastroenterologist: Dr. [name] 09/27/2014  Procedures:  Chest x-ray 09/27/2014  2-D echo 09/30/2014  Antibiotics:  IV Unasyn 09/27/2014>>>>> 10/02/2014  HPI/Subjective: Patient denies any further abdominal pain. No nausea or emesis. Tolerating current diet.   Objective: Filed Vitals:   10/03/14 0559  BP: 118/69  Pulse: 65  Temp: 98.6 F (37 C)  Resp: 19    Intake/Output Summary (Last 24 hours) at 10/03/14 1546 Last data filed at 10/02/14 1854  Gross per 24 hour  Intake    240 ml  Output      0 ml  Net    240 ml   Filed Weights   10/01/14 0500 10/02/14 0320 10/03/14 0559  Weight: 68.2 kg (150 lb 5.7 oz) 67.9 kg (149 lb 11.1 oz) 68.3 kg (150 lb 9.2 oz)    Exam:   General:  NAD  Cardiovascular: RRR  Respiratory: CTAB  Abdomen: Soft, nontender, nondistended, positive bowel sounds.  Musculoskeletal: No clubbing cyanosis or edema.  Data Reviewed: Basic Metabolic Panel:  Recent Labs Lab 09/28/14 0400  09/30/14 0433 09/30/14 2333 10/01/14 0320 10/02/14 0350 10/03/14 0321  NA 146*  < > 144 141 139 139 140  K 3.6  < > 3.2* 3.5 3.5 3.9 5.4*  CL 113*  < > 107 103 101 101 102  CO2 23  < > _0 GLUCOSE 67  < > 63* 96 95 106* 102*  BUN 45*  < > 36* 32* 30* 35* 37*  CREATININE 1.91*  < > 2.09* 1.83* 1.80* 1.77* 1.83*  CALCIUM 8.8*  < > 9.1 9.2 9.2 9.5 9.0  MG 2.3  --   --   --  2.0 2.0  --   < > = values in this interval not displayed. Liver Function Tests:  Recent Labs Lab 09/30/14 0433 09/30/14 2333 10/01/14 0320 10/02/14 0350  10/03/14 0321  AST _1 52*  ALT 87* 65* 63 47 39  ALKPHOS 170* 157* 152* 144* 132*  BILITOT 1.6* 1.3* 1.2 0.7 2.0*  PROT 5.8* 5.4* 5.4* 5.6* 5.0*  ALBUMIN 2.9* 2.8* 2.8* 2.9* 2.7*    Recent Labs Lab 09/29/14 0317 09/30/14 0433 10/01/14 0320 10/02/14 0350 10/03/14 0321  LIPASE 919* 80* 115* 278* 588*   No results for input(s): AMMONIA in the last 168 hours. CBC:  Recent Labs Lab 09/27/14 0500 09/27/14 1345 09/28/14 0400 09/30/14 0433 10/02/14 0350 10/03/14 0533  WBC 9.3  --  6.4 5.7 6.9 6.2  NEUTROABS  --   --   --  4.0  --  4.5  HGB 9.0*  --  9.3* 10.5* 11.2* 10.2*  HCT 29.5* 30.9* 32.0* 35.6* 36.3* 33.3*  MCV 84.0  --  85.8 84.8 81.8 82.6  PLT 129*  --  147* 165 155 129*   Cardiac Enzymes: No results for input(s): CKTOTAL, CKMB, CKMBINDEX, TROPONINI in the last 168 hours. BNP (last 3 results) No results for input(s): BNP in the last 8760 hours.  ProBNP (last 3 results) No results for input(s): PROBNP in the last 8760 hours.  CBG:  Recent  Labs Lab 10/02/14 1129 10/02/14 1704 10/02/14 2034 10/03/14 0837 10/03/14 1147  GLUCAP 135* 127* 130* 104* 96    Recent Results (from the past 240 hour(s))  Urine culture     Status: None   Collection Time: 09/27/14  2:15 PM  Result Value Ref Range Status   Specimen Description URINE, RANDOM  Final   Special Requests NONE  Final   Culture NO GROWTH 1 DAY  Final   Report Status 09/28/2014 FINAL  Final     Studies: No results found.  Scheduled Meds: . ampicillin-sulbactam (UNASYN) IV  1.5 g Intravenous To Endo  . antiseptic oral rinse  7 mL Mouth Rinse q12n4p  . chlorhexidine  15 mL Mouth Rinse BID  . famotidine  20 mg Oral Daily  . furosemide  80 mg Oral BID  . metoprolol succinate  50 mg Oral Daily  . simvastatin  20 mg Oral q1800   Continuous Infusions:   Active Problems:   Pancreatitis, gallstone   Pressure ulcer   Choledocholithiasis   Cholecystitis, acute   Anemia, iron deficiency    Hyperglycemia   Chronic kidney disease (CKD), stage IV (severe)   Hypokalemia   Chronic diastolic CHF (congestive heart failure)   Pancreatitis, acute   Acute on chronic diastolic CHF (congestive heart failure)    Time spent: 76 minutes    THOMPSON,DANIEL M.D. [facility] Pager [phone]. If 7PM-7AM, please contact night-coverage at [email or web address], password [facility] 10/03/2014, 3:46 PM  LOS: 6 days

## 2014-10-03 NOTE — Anesthesia Preprocedure Evaluation (Addendum)
Anesthesia Evaluation  Patient identified by MRN, date of birth, ID band Patient awake    Reviewed: Allergy & Precautions, NPO status , Patient's Chart, lab work & pertinent test results, reviewed documented beta blocker date and time   History of Anesthesia Complications Negative for: history of anesthetic complications  Airway Mallampati: II  TM Distance: <3 FB Neck ROM: Full    Dental  (+) Edentulous Upper, Edentulous Lower   Pulmonary COPD COPD inhaler,  breath sounds clear to auscultation        Cardiovascular hypertension, Pt. on medications and Pt. on home beta blockers Rhythm:Regular Rate:Normal  09/30/14 ECHO: EF 55-60%, grade 1 diastolic dysfunction, valves OK   Neuro/Psych negative neurological ROS     GI/Hepatic Neg liver ROS, GERD-  Medicated and Controlled,CBD stone: pancreatitis   Endo/Other  negative endocrine ROS  Renal/GU Renal InsufficiencyRenal disease (creat 1.83, K+ 5.4)     Musculoskeletal   Abdominal   Peds  Hematology  (+) Blood dyscrasia (Hb 10.2), ,   Anesthesia Other Findings   Reproductive/Obstetrics                          Anesthesia Physical Anesthesia Plan  ASA: III  Anesthesia Plan: General   Post-op Pain Management:    Induction: Intravenous  Airway Management Planned: Oral ETT  Additional Equipment:   Intra-op Plan:   Post-operative Plan: Extubation in OR  Informed Consent: I have reviewed the patients History and Physical, chart, labs and discussed the procedure including the risks, benefits and alternatives for the proposed anesthesia with the patient or authorized representative who has indicated his/her understanding and acceptance.     Plan Discussed with: CRNA and Surgeon  Anesthesia Plan Comments: (Plan routine monitors, GETA)        Anesthesia Quick Evaluation

## 2014-10-03 NOTE — Clinical Social Work Placement (Signed)
   CLINICAL SOCIAL WORK PLACEMENT  NOTE  Date:  10/03/2014  Patient Details  Name: Kyley Laurel MRN: [id number] Date of Birth: 1925/11/02  Clinical Social Work is seeking post-discharge placement for this patient at the Skilled  Nursing Facility level of care (*CSW will initial, date and re-position this form in  chart as items are completed):  Yes   Patient/family provided with [place] Clinical Social Work Department's list of facilities offering this level of care within the geographic area requested by the patient (or if unable, by the patient's family).  Yes   Patient/family informed of their freedom to choose among providers that offer the needed level of care, that participate in Medicare, Medicaid or managed care program needed by the patient, have an available bed and are willing to accept the patient.  Yes   Patient/family informed of [facility]'s ownership interest in [facility] and [facility], as well as of the fact that they are under no obligation to receive care at these facilities.  PASRR submitted to EDS on 10/03/14     PASRR number received on 10/03/14     Existing PASRR number confirmed on  (n/a)     FL2 transmitted to all facilities in geographic area requested by pt/family on 10/03/14     FL2 transmitted to all facilities within larger geographic area on  (n/a)     Patient informed that his/her managed care company has contracts with or will negotiate with certain facilities, including the following:   (yes, [facility])         Patient/family informed of bed offers received.  Patient chooses bed at       Physician recommends and patient chooses bed at      Patient to be transferred to   on  .  Patient to be transferred to facility by       Patient family notified on   of transfer.  Name of family member notified:        PHYSICIAN Please sign DNR, Please sign FL2     Additional Comment:     _______________________________________________ Rod Mae, LCSW 10/03/2014, 3:39 PM

## 2014-10-04 ENCOUNTER — Encounter (HOSPITAL_COMMUNITY): Payer: Self-pay | Admitting: Certified Registered"

## 2014-10-04 ENCOUNTER — Inpatient Hospital Stay (HOSPITAL_COMMUNITY): Payer: Medicare Other | Admitting: Anesthesiology

## 2014-10-04 ENCOUNTER — Inpatient Hospital Stay (HOSPITAL_COMMUNITY): Payer: Medicare Other

## 2014-10-04 ENCOUNTER — Encounter (HOSPITAL_COMMUNITY)
Admission: AD | Disposition: A | Discharge status: Home Health | Payer: Self-pay | Source: Other Acute Inpatient Hospital | Attending: Internal Medicine

## 2014-10-04 DIAGNOSIS — B370 Candidal stomatitis: Secondary | ICD-10-CM | POA: Diagnosis not present

## 2014-10-04 HISTORY — PX: ERCP: SHX5425

## 2014-10-04 LAB — COMPREHENSIVE METABOLIC PANEL
ALBUMIN: 2.9 g/dL — AB (ref 3.5–5.0)
ALT: 31 U/L (ref 17–63)
AST: 25 U/L (ref 15–41)
Alkaline Phosphatase: 133 U/L — ABNORMAL HIGH (ref 38–126)
Anion gap: 10 (ref 5–15)
BUN: 30 mg/dL — AB (ref 6–20)
CHLORIDE: 101 mmol/L (ref 101–111)
CO2: 30 mmol/L (ref 22–32)
Calcium: 9 mg/dL (ref 8.9–10.3)
Creatinine, Ser: 1.99 mg/dL — ABNORMAL HIGH (ref 0.61–1.24)
GFR calc Af Amer: 33 mL/min — ABNORMAL LOW (ref >60)
GFR, EST NON AFRICAN AMERICAN: 29 mL/min — AB (ref >60)
GLUCOSE: 103 mg/dL — AB (ref 65–99)
POTASSIUM: 3.3 mmol/L — AB (ref 3.5–5.1)
Sodium: 141 mmol/L (ref 135–145)
Total Bilirubin: 1 mg/dL (ref 0.3–1.2)
Total Protein: 5.6 g/dL — ABNORMAL LOW (ref 6.5–8.1)

## 2014-10-04 LAB — GLUCOSE, CAPILLARY
GLUCOSE-CAPILLARY: 103 mg/dL — AB (ref 65–99)
GLUCOSE-CAPILLARY: 122 mg/dL — AB (ref 65–99)
Glucose-Capillary: 105 mg/dL — ABNORMAL HIGH (ref 65–99)
Glucose-Capillary: 173 mg/dL — ABNORMAL HIGH (ref 65–99)

## 2014-10-04 LAB — LIPASE, BLOOD: LIPASE: 122 U/L — AB (ref 22–51)

## 2014-10-04 SURGERY — ERCP, WITH INTERVENTION IF INDICATED
Anesthesia: General

## 2014-10-04 MED ORDER — ROCURONIUM BROMIDE 100 MG/10ML IV SOLN
INTRAVENOUS | Status: DC | PRN
  Administered 2014-10-04: 30 mg via INTRAVENOUS

## 2014-10-04 MED ORDER — LIDOCAINE HCL (CARDIAC) 20 MG/ML IV SOLN
INTRAVENOUS | Status: DC | PRN
  Administered 2014-10-04: 20 mg via INTRAVENOUS

## 2014-10-04 MED ORDER — FLUCONAZOLE 100 MG PO TABS
100.0000 mg | ORAL_TABLET | Freq: Every day | ORAL | Status: DC
  Administered 2014-10-05 – 2014-10-07 (×3): 100 mg via ORAL
  Filled 2014-10-04 (×3): qty 1

## 2014-10-04 MED ORDER — SODIUM CHLORIDE 0.9 % IV SOLN: INTRAVENOUS | Status: DC

## 2014-10-04 MED ORDER — FENTANYL CITRATE (PF) 100 MCG/2ML IJ SOLN: 25.0000 ug | INTRAMUSCULAR | Status: DC | PRN

## 2014-10-04 MED ORDER — SODIUM CHLORIDE 0.9 % IV SOLN
INTRAVENOUS | Status: DC | PRN
  Administered 2014-10-04: 09:00:00 via INTRAVENOUS

## 2014-10-04 MED ORDER — PROPOFOL 10 MG/ML IV BOLUS
INTRAVENOUS | Status: DC | PRN
  Administered 2014-10-04: 80 mg via INTRAVENOUS

## 2014-10-04 MED ORDER — POTASSIUM CHLORIDE CRYS ER 20 MEQ PO TBCR
40.0000 meq | EXTENDED_RELEASE_TABLET | Freq: Once | ORAL | Status: AC
  Administered 2014-10-04: 40 meq via ORAL
  Filled 2014-10-04: qty 2

## 2014-10-04 MED ORDER — SODIUM CHLORIDE 0.9 % IV SOLN
INTRAVENOUS | Status: DC | PRN
  Administered 2014-10-04: 10 mL

## 2014-10-04 MED ORDER — GLYCOPYRROLATE 0.2 MG/ML IJ SOLN
INTRAMUSCULAR | Status: DC | PRN
  Administered 2014-10-04: 0.3 mg via INTRAVENOUS

## 2014-10-04 MED ORDER — ONDANSETRON HCL 4 MG/2ML IJ SOLN
INTRAMUSCULAR | Status: DC | PRN
  Administered 2014-10-04: 4 mg via INTRAVENOUS

## 2014-10-04 MED ORDER — FENTANYL CITRATE (PF) 100 MCG/2ML IJ SOLN
INTRAMUSCULAR | Status: DC | PRN
  Administered 2014-10-04: 25 ug via INTRAVENOUS

## 2014-10-04 MED ORDER — FLUCONAZOLE 100 MG PO TABS
200.0000 mg | ORAL_TABLET | Freq: Once | ORAL | Status: AC
  Administered 2014-10-04: 200 mg via ORAL
  Filled 2014-10-04: qty 2

## 2014-10-04 MED ORDER — FENTANYL CITRATE (PF) 250 MCG/5ML IJ SOLN
INTRAMUSCULAR | Status: AC
  Filled 2014-10-04: qty 5

## 2014-10-04 MED ORDER — EPHEDRINE SULFATE 50 MG/ML IJ SOLN
INTRAMUSCULAR | Status: DC | PRN
  Administered 2014-10-04 (×2): 10 mg via INTRAVENOUS

## 2014-10-04 MED ORDER — NEOSTIGMINE METHYLSULFATE 10 MG/10ML IV SOLN
INTRAVENOUS | Status: DC | PRN
  Administered 2014-10-04: 2 mg via INTRAVENOUS

## 2014-10-04 NOTE — Progress Notes (Signed)
Pt going down to Endo, consent obtained from daughter, Matthew Folks and witnessed by Lisette Grinder, RN.  Dentures taken out and placed at bedside.  Right hearing aide placed so pt can hear more easily.  Oral care done.  Noted thrush on left side of tongue.

## 2014-10-04 NOTE — Anesthesia Postprocedure Evaluation (Signed)
  Anesthesia Post-op Note  Patient: Brandon Floyd  Procedure(s) Performed: Procedure(s): ENDOSCOPIC RETROGRADE CHOLANGIOPANCREATOGRAPHY (ERCP) (N/A)  Patient Location: PACU  Anesthesia Type:General  Level of Consciousness: awake, alert , oriented, patient cooperative and responds to stimulation  Airway and Oxygen Therapy: Patient Spontanous Breathing and Patient connected to nasal cannula oxygen  Post-op Pain: none  Post-op Assessment: Post-op Vital signs reviewed, Patient's Cardiovascular Status Stable, Respiratory Function Stable, Patent Airway, No signs of Nausea or vomiting and Pain level controlled              Post-op Vital Signs: Reviewed and stable  Last Vitals:  Filed Vitals:   10/04/14 1015  BP: 134/91  Pulse: 73  Temp:   Resp: 17    Complications: No apparent anesthesia complications

## 2014-10-04 NOTE — Op Note (Signed)
[facility] [address] [place], [address]   ERCP PROCEDURE REPORT        EXAM DATE: 10/04/2014  PATIENT NAME:          Brandon Floyd, Brandon Floyd          MR #:        [id number]  BIRTHDATE:       05-29-1925     VISIT #:     [id number] ATTENDING:     Dorena Cookey, MD     STATUS:     outpatient ASSISTANT:      Magdalene Molly, and Waunita Schooner  INDICATIONS:  The patient is a 81 yr old male here for an ERCP due to recurrent pancreatitis and suspected common bile duct stones PROCEDURE PERFORMED:     ERCP with sphincterotomy and stone extraction MEDICATIONS:     general anesthesia.  CONSENT: The patient understands the risks and benefits of the procedure and understands that these risks include, but are not limited to: sedation, allergic reaction, infection, perforation and/or bleeding. Alternative means of evaluation and treatment include, among others: physical exam, x-rays, and/or surgical intervention. The patient elects to proceed with this endoscopic procedure.  DESCRIPTION OF PROCEDURE: During intra-op preparation period all mechanical & medical equipment was checked for proper function. Hand hygiene and appropriate measures for infection prevention was taken. After the risks, benefits and alternatives of the procedure were thoroughly explained, Informed was verified, confirmed and timeout was successfully executed by the treatment team. With the patient in left semi-prone position, medications were administered intravenously.The Pentax ERCP [id number] was passed from the mouth into the esophagus and further advanced from the esophagus into the stomach. From stomach scope was directed to the papilla of Vater.     .  Major papilla was aligned with the duodenoscope. The scope position was confirmed fluoroscopically. Rest of the findings/therapeutics are given below. The scope was then completely withdrawn from the patient and the  procedure completed. The pulse, BP, and O2 saturation were monitored and documented by the physician and the nursing staff throughout the entire procedure. The patient was cared for as planned according to standard protocol. The patient was then discharged to recovery in stable condition and with appropriate post procedure care. Estimated blood loss is zero unless otherwise noted in this procedure report.  there was a moderate size diverticulum to the left of the papilla. I could not see any visible contents within it. deep cannulation of the common bile duct revealed a diffusely dilated common bile duct as well as distal intrahepatic ductal dilatation. No definite filling defects were seen. A moderate sized sphincterotomy was performed which led to egress of clear Amber bile with some sludge fragments. A 15-18 mm balloon catheter was advanced into the common hepatic duct and inflated to 18 mm and pulled through the sphincterotomy I stopped papilla. There were some small stone fragments and amorphous sludge removed. 3 more balloon sweeps were made until there were no longer any stone fragments or sludge seen to exit. The pancreatic duct was not entered or opacified. There was excellent drainage of bile at the end of the procedure.     ADVERSE EVENT:     none immediate IMPRESSIONS:     common bile duct sludge and small stones, removed after sphincterotomy  RECOMMENDATIONS:     observe for complications. REPEAT EXAM:     advance diet and check liver function tests in the morning.   ___________________________________ Jonny Ruiz  Madilyn Fireman, MD eSigned:  Dorena Cookey, MD 10-07-2014 10:10 AM   cc:  CPT CODES: ICD9 CODES:  The ICD and CPT codes recommended by this software are interpretations from the data that the clinical staff has captured with the software.  The verification of the translation of this report to the ICD and CPT codes and modifiers is the sole responsibility of the  health care institution and practicing physician where this report was generated.  [employer] will not be held responsible for the validity of the ICD and CPT codes included on this report.  AMA assumes no liability for data contained or not contained herein. CPT is a Publishing rights manager of the [employer].   PATIENT NAME:  Taurus, Alamo MR#: [id number]

## 2014-10-04 NOTE — Clinical Social Work Note (Signed)
Clinical Social Worker has attempted to contact patient's dtr, [name] several times this morning and afternoon in reference to discharge planning.   CSW remains available as needed.   Derenda Fennel, MSW, LCSWA [phone] 10/04/2014 12:49 PM

## 2014-10-04 NOTE — Progress Notes (Signed)
[facility] PROGRESS NOTE  Brandon Floyd ZOX:[id number] DOB: [birth year] DOA: 09/27/2014 PCP: [name], MD  Brief history 81 year old male with a history of CKD stage IV, left-sided nephrectomy for renal cell carcinoma, hypertension, COPD, cardiomyopathy, and hyperlipidemia presented to [facility] ED with 24 hours of upper abdominal pain that was worse with meals. CT of the abdomen and pelvis at [facility] revealed small calcified structures of the head of the pancreas which may possibly be in the distal common bile duct near the ampulla. There was also a tiny amount of pneumobilia. The patient had cholecystectomy over 10 years ago. He was noted to have WBC 15.5, AST 628, ALT 405, alk phosphatase 294, total bilirubin 2.8, lipase 3927. However, the pancreas did not have any inflammatory changes on CT. Because of concerns of cholangitis and choledocholithiasis, the patient was transferred to [facility] for possible ERCP. [facility] has been consulted-->conservative tx for now. Patient was improving clinically and was started on a diet. After patient was started on a low-fat diet patient did have some nausea however abdominal pain had improved. LFTs were noted to be elevated and a significant rise in lipase to 588 and as such patient's underwent ERCP with sphincterotomy and stone extraction 10/04/2014 per GI.  Assessment/Plan: #1 acute pancreatitis/hyperbilirubinemia/transaminitis Likely secondary to common bile duct sludge and small stones per ERCP with sphincterotomy and stone extraction 10/04/2014. Patient with clinical improvement. Patient with prior history of cholecystectomy as noted per CT of the abdomen the [facility]. Patient daughter stated that patient had biliary pancreatitis approximately 15-20 years ago when he had his cholecystectomy. Patient has been seen in consultation by Dr. [name] of gastroenterology who feels that the possible explanation for large calcifications which  estimated to be a centimeter in size are: Calcifications in the head of the pancreas, pedal fragments trapped in the duodenal diverticulum, or large CBD/PD stone. GI did not feel patient needed urgent ERC initially on admission. Patient has tolerated clear liquid diet which was started on 09/30/2014. D/c IV Unasyn. Patient's diet has been advanced to a low-fat diet per GI which patient tolerated. Comprehensive metabolic profile with elevated LFTs and lipase levels have risen significantly to 588 from 278 from 115 the day before. Bilirubin is also elevated at 2. Due to significant elevation in lipase and concern for gallstone pancreatitis patient underwent  ERCP  with sphincterotomy and stone extraction today 10/04/2014. On ERCP patient was noted to have, bile duct sludge and small stones which were removed after sphincterotomy. Patient's diet has been advanced per GI. Will follow LFTs and lipase levels in the morning. Per GI.   #2 hypertension Stable. Continue metoprolol.  #3 acute on chronic diastolic CHF Likely secondary to fluid resuscitation. 2-D echo from 07/28/2008 with a EF of 55-60% with grade 1 diastolic dysfunction. Patient is status post PPM. Daughter describes bradycardia/some type of heart block as reason for PPM. Patient's IV fluids have been saline locked. Patient is currently compensated. Patient is -1.952 L during this hospitalization. Continue home dose oral Lasix. Symptomatic improvement. Outpatient follow-up.  #4 mild hypoglycemia Secondary to nothing by mouth status and poor oral intake. Patient currently on a diet. Follow.  #5 chronic pain Continue current pain regimen. Resumed patient's home regimen of Percocet.  #6 chronic kidney disease stage IV Baseline creatinine 2.1-2.4. Currently better than baseline. Follow.  #7 hyperlipidemia Continue statin.  #8 hypokalemia Replete.  #9 and deficiency anemia/anemia of chronic disease. B-12 levels of 1799. RBC folate within  normal limits. Iron saturation  was 4%. Ferritin of 65. Patient status post 1 dose of IV iron. Patient will need to be started on oral iron at time of discharge.  #10 deconditioning/generalized weakness Patient has been seen by PT. PT recommending skilled nursing facility. Patient hesitant to go to a skilled nursing facility and wants to go back home. If Patient goes home will set up with home health.  #11 oral thrush Patient noted to have some thrush on the left side of his tongue. Diflucan.   #12 prophylaxis SCDs for DVT prophylaxis.  Code Status: DO NOT RESUSCITATE Family Communication: Updated patient and daughter at bedside. Disposition Plan: Home with home health hopefully tomorrow or per GI rcxs.   Consultants:  Gastroenterologist: Dr. [name] 09/27/2014  Procedures:  Chest x-ray 09/27/2014  2-D echo 09/30/2014  ERCP with sphincterotomy and stone extraction 10/04/2014 per Dr. Amedeo Plenty  Antibiotics:  IV Unasyn 09/27/2014>>>>> 10/02/2014  HPI/Subjective: Patient denies any further abdominal pain. No nausea or emesis. Patient states he is going home tomorrow. Patient states he's been here a week and is tired of sitting here.  Objective: Filed Vitals:   10/04/14 1255  BP: 122/62  Pulse: 64  Temp: 97.8 F (36.6 C)  Resp: 16    Intake/Output Summary (Last 24 hours) at 10/04/14 1549 Last data filed at 10/04/14 1000  Gross per 24 hour  Intake    500 ml  Output    600 ml  Net   -100 ml   Filed Weights   10/02/14 0320 10/03/14 0559 10/04/14 0537  Weight: 67.9 kg (149 lb 11.1 oz) 68.3 kg (150 lb 9.2 oz) 68.6 kg (151 lb 3.8 oz)    Exam:   General:  NAD  Cardiovascular: RRR  Respiratory: CTAB  Abdomen: Soft, nontender, nondistended, positive bowel sounds.  Musculoskeletal: No clubbing cyanosis or edema.  Data Reviewed: Basic Metabolic Panel:  Recent Labs Lab 09/28/14 0400  09/30/14 2333 10/01/14 0320 10/02/14 0350 10/03/14 0321 10/04/14 1145  NA  146*  < > 141 139 139 140 141  K 3.6  < > 3.5 3.5 3.9 5.4* 3.3*  CL 113*  < > 103 101 101 102 101  CO2 23  < > 27 27 26 26 30   GLUCOSE 67  < > 96 95 106* 102* 103*  BUN 45*  < > 32* 30* 35* 37* 30*  CREATININE 1.91*  < > 1.83* 1.80* 1.77* 1.83* 1.99*  CALCIUM 8.8*  < > 9.2 9.2 9.5 9.0 9.0  MG 2.3  --   --  2.0 2.0  --   --   < > = values in this interval not displayed. Liver Function Tests:  Recent Labs Lab 09/30/14 2333 10/01/14 0320 10/02/14 0350 10/03/14 0321 10/04/14 1145  AST 27 24 25  52* 25  ALT 65* 63 47 39 31  ALKPHOS 157* 152* 144* 132* 133*  BILITOT 1.3* 1.2 0.7 2.0* 1.0  PROT 5.4* 5.4* 5.6* 5.0* 5.6*  ALBUMIN 2.8* 2.8* 2.9* 2.7* 2.9*    Recent Labs Lab 09/30/14 0433 10/01/14 0320 10/02/14 0350 10/03/14 0321 10/04/14 1145  LIPASE 80* 115* 278* 588* 122*   No results for input(s): AMMONIA in the last 168 hours. CBC:  Recent Labs Lab 09/28/14 0400 09/30/14 0433 10/02/14 0350 10/03/14 0533  WBC 6.4 5.7 6.9 6.2  NEUTROABS  --  4.0  --  4.5  HGB 9.3* 10.5* 11.2* 10.2*  HCT 32.0* 35.6* 36.3* 33.3*  MCV 85.8 84.8 81.8 82.6  PLT 147* 165 155 129*  Cardiac Enzymes: No results for input(s): CKTOTAL, CKMB, CKMBINDEX, TROPONINI in the last 168 hours. BNP (last 3 results) No results for input(s): BNP in the last 8760 hours.  ProBNP (last 3 results) No results for input(s): PROBNP in the last 8760 hours.  CBG:  Recent Labs Lab 10/03/14 1147 10/03/14 1753 10/03/14 2111 10/04/14 0751 10/04/14 1254  GLUCAP 96 135* 83 103* 105*    Recent Results (from the past 240 hour(s))  Urine culture     Status: None   Collection Time: 09/27/14  2:15 PM  Result Value Ref Range Status   Specimen Description URINE, RANDOM  Final   Special Requests NONE  Final   Culture NO GROWTH 1 DAY  Final   Report Status 09/28/2014 FINAL  Final     Studies: Dg Ercp Biliary & Pancreatic Ducts  10/04/2014   CLINICAL DATA:  Common bile duct stone.  EXAM: ERCP  TECHNIQUE:  Multiple spot images obtained with the fluoroscopic device and submitted for interpretation post-procedure.  FLUOROSCOPY TIME:  2 minutes, 27 seconds  COMPARISON:  CT abdomen pelvis -09/26/2007  FINDINGS: Two spot intraoperative fluoroscopic images of the right upper abdominal quadrant during ERCP are provided for review.  Initial image demonstrates an ERCP probe overlying the right upper abdominal quadrant. Surgical clips overlie the expected location of the gallbladder fossa. There is selective cannulation and opacification of the CBD which appears at least moderately dilated. There are no definitive opacities seen with the opacified portions of the biliary tree, however adjacent to the medial aspect of the insertion of the CBD are 2 discrete opacities which may correlate with the stones seen regional to the pancreatic head on preceding abdominal CT.  Subsequent image demonstrates partial retract of the ERCP probe with a minimal amount of residual contrast within the CBD. The adjacent discrete opacities appear unchanged in position/location.  There is no definitive opacification of the pancreatic duct.  IMPRESSION: 1. ERCP with persistent findings of at least moderate dilatation of the CBD. 2. While there is no definitive evidence of choledocholithiasis, there are 2 discrete opacities adjacent to the medial aspect of the insertion of CBD which likely correlate with the laminated calcifications seen regional to the pancreatic head on preceding noncontrast abdominal CT. Correlation the operative report is recommended. These images were submitted for radiologic interpretation only. Please see the procedural report for the amount of contrast and the fluoroscopy time utilized.   Electronically Signed   By: Sandi Mariscal M.D.   On: 10/04/2014 10:21    Scheduled Meds: . antiseptic oral rinse  7 mL Mouth Rinse q12n4p  . chlorhexidine  15 mL Mouth Rinse BID  . famotidine  20 mg Oral Daily  . [START ON 10/05/2014]  fluconazole  100 mg Oral Daily  . furosemide  80 mg Oral BID  . metoprolol succinate  50 mg Oral Daily  . potassium chloride  40 mEq Oral Once  . simvastatin  20 mg Oral q1800   Continuous Infusions:   Principal Problem:   Pancreatitis, gallstone Active Problems:   Pressure ulcer   Choledocholithiasis   Cholecystitis, acute   Anemia, iron deficiency   Hyperglycemia   Chronic kidney disease (CKD), stage IV (severe)   Hypokalemia   Chronic diastolic CHF (congestive heart failure)   Pancreatitis, acute   Acute on chronic diastolic CHF (congestive heart failure)   Oral thrush    Time spent: 20 minutes    THOMPSON,DANIEL M.D. [facility] Pager [phone]. If 7PM-7AM, please contact  night-coverage at [email or web address], password [facility] 10/04/2014, 3:49 PM  LOS: 7 days

## 2014-10-04 NOTE — Clinical Social Work Note (Signed)
Clinical Social Worker noted that PT recommendations have changed to Home Health. CSW confirmed with patient's dtr and family at bedside that patient will be returning home with Home Health services.   Clinical Social Worker will sign off for now as social work intervention is no longer needed. Please consult [place] again if new need arises.  Derenda Fennel, MSW, LCSWA [phone] 10/04/2014 2:05 PM

## 2014-10-04 NOTE — Anesthesia Procedure Notes (Signed)
Procedure Name: Intubation Date/Time: 10/04/2014 9:17 AM Performed by: De Nurse Pre-anesthesia Checklist: Patient identified, Emergency Drugs available, Suction available, Patient being monitored and Timeout performed Patient Re-evaluated:Patient Re-evaluated prior to inductionOxygen Delivery Method: Circle system utilized Preoxygenation: Pre-oxygenation with 100% oxygen Intubation Type: IV induction Ventilation: Mask ventilation without difficulty and Oral airway inserted - appropriate to patient size Laryngoscope Size: Mac and 3 Grade View: Grade I Tube type: Oral Tube size: 7.5 mm Number of attempts: 1 Airway Equipment and Method: Stylet Placement Confirmation: ETT inserted through vocal cords under direct vision,  positive ETCO2 and breath sounds checked- equal and bilateral Secured at: 21 cm Tube secured with: Tape Dental Injury: Teeth and Oropharynx as per pre-operative assessment

## 2014-10-04 NOTE — Transfer of Care (Signed)
Immediate Anesthesia Transfer of Care Note  Patient: Brandon Floyd  Procedure(s) Performed: Procedure(s): ENDOSCOPIC RETROGRADE CHOLANGIOPANCREATOGRAPHY (ERCP) (N/A)  Patient Location: PACU  Anesthesia Type:General  Level of Consciousness: awake and patient cooperative  Airway & Oxygen Therapy: Patient Spontanous Breathing and Patient connected to nasal cannula oxygen  Post-op Assessment: Report given to RN  Post vital signs: Reviewed and stable  Last Vitals:  Filed Vitals:   10/04/14 1011  BP: 148/79  Pulse: 88  Temp:   Resp: 24    Complications: No apparent anesthesia complications

## 2014-10-05 DIAGNOSIS — K8050 Calculus of bile duct without cholangitis or cholecystitis without obstruction: Secondary | ICD-10-CM | POA: Insufficient documentation

## 2014-10-05 LAB — COMPREHENSIVE METABOLIC PANEL
ALK PHOS: 132 U/L — AB (ref 38–126)
ALT: 26 U/L (ref 17–63)
AST: 23 U/L (ref 15–41)
Albumin: 2.7 g/dL — ABNORMAL LOW (ref 3.5–5.0)
Anion gap: 10 (ref 5–15)
BUN: 27 mg/dL — ABNORMAL HIGH (ref 6–20)
CALCIUM: 8.9 mg/dL (ref 8.9–10.3)
CO2: 27 mmol/L (ref 22–32)
CREATININE: 2.02 mg/dL — AB (ref 0.61–1.24)
Chloride: 101 mmol/L (ref 101–111)
GFR calc non Af Amer: 28 mL/min — ABNORMAL LOW (ref >60)
GFR, EST AFRICAN AMERICAN: 33 mL/min — AB (ref >60)
Glucose, Bld: 125 mg/dL — ABNORMAL HIGH (ref 65–99)
Potassium: 3.5 mmol/L (ref 3.5–5.1)
SODIUM: 138 mmol/L (ref 135–145)
Total Bilirubin: 1 mg/dL (ref 0.3–1.2)
Total Protein: 5.6 g/dL — ABNORMAL LOW (ref 6.5–8.1)

## 2014-10-05 LAB — GLUCOSE, CAPILLARY
GLUCOSE-CAPILLARY: 112 mg/dL — AB (ref 65–99)
Glucose-Capillary: 122 mg/dL — ABNORMAL HIGH (ref 65–99)
Glucose-Capillary: 95 mg/dL (ref 65–99)

## 2014-10-05 LAB — LIPASE, BLOOD: Lipase: 515 U/L — ABNORMAL HIGH (ref 22–51)

## 2014-10-05 MED ORDER — SODIUM CHLORIDE 0.9 % IV SOLN
INTRAVENOUS | Status: AC
  Administered 2014-10-05 (×2): via INTRAVENOUS

## 2014-10-05 MED ORDER — POTASSIUM CHLORIDE CRYS ER 20 MEQ PO TBCR
40.0000 meq | EXTENDED_RELEASE_TABLET | Freq: Once | ORAL | Status: DC
  Filled 2014-10-05: qty 2

## 2014-10-05 MED ORDER — TAMSULOSIN HCL 0.4 MG PO CAPS
0.4000 mg | ORAL_CAPSULE | Freq: Every day | ORAL | Status: DC
  Administered 2014-10-05 – 2014-10-06 (×2): 0.4 mg via ORAL
  Filled 2014-10-05 (×2): qty 1

## 2014-10-05 NOTE — Progress Notes (Addendum)
[facility] PROGRESS NOTE  Brandon Floyd QJJ:[id number] DOB: [birth year] DOA: 09/27/2014 PCP: [name], MD  Brief history 81 year old male with a history of CKD stage IV, left-sided nephrectomy for renal cell carcinoma, hypertension, COPD, cardiomyopathy, and hyperlipidemia presented to [facility] ED with 24 hours of upper abdominal pain that was worse with meals. CT of the abdomen and pelvis at [facility] revealed small calcified structures of the head of the pancreas which may possibly be in the distal common bile duct near the ampulla. There was also a tiny amount of pneumobilia. The patient had cholecystectomy over 10 years ago. He was noted to have WBC 15.5, AST 628, ALT 405, alk phosphatase 294, total bilirubin 2.8, lipase 3927. However, the pancreas did not have any inflammatory changes on CT. Because of concerns of cholangitis and choledocholithiasis, the patient was transferred to [facility] for possible ERCP. [facility] has been consulted-->conservative tx for now. Patient was improving clinically and was started on a diet. After patient was started on a low-fat diet patient did have some nausea however abdominal pain had improved. LFTs were noted to be elevated and a significant rise in lipase to 588 and as such patient's underwent ERCP with sphincterotomy and stone extraction 10/04/2014 per GI.  Assessment/Plan: #1 acute pancreatitis/hyperbilirubinemia/transaminitis Likely secondary to common bile duct sludge and small stones per ERCP with sphincterotomy and stone extraction 10/04/2014. Patient with clinical improvement. Patient states has not eating today. Lipase levels are back elevated again at 515 today. Per daughter patient with some abdominal pain last night when eating. Patient has not eating today. Patient encouraged to have lunch. Patient with prior history of cholecystectomy as noted per CT of the abdomen the [facility]. Patient daughter stated that patient had biliary  pancreatitis approximately 15-20 years ago when he had his cholecystectomy. Patient has been seen in consultation by Dr. [name] of gastroenterology who feels that the possible explanation for large calcifications which estimated to be a centimeter in size are: Calcifications in the head of the pancreas, pedal fragments trapped in the duodenal diverticulum, or large CBD/PD stone. GI did not feel patient needed urgent ERC initially on admission. Patient has tolerated clear liquid diet which was started on 09/30/2014. D/c IV Unasyn. Patient's diet has been advanced to a low-fat diet per GI which patient tolerated. Comprehensive metabolic profile with elevated LFTs and lipase levels have risen significantly to 515 post procedure from 122 from 588 from 278 from 115 the day before. Bilirubin is normalized. Elevated lipase may be secondary to ERCP.  Due to significant elevation in lipase and concern for gallstone pancreatitis patient underwent  ERCP  with sphincterotomy and stone extraction 10/04/2014. On ERCP patient was noted to have, bile duct sludge and small stones which were removed after sphincterotomy. Patient's diet has been advanced per GI. Lipase levels this morning elevated at 515 again. Patient did not have breakfast this morning. LFTs have trended down. Per GI. Repeat labs in the morning.  #2 hypertension Stable. Continue metoprolol.  #3 acute on chronic diastolic CHF Likely secondary to fluid resuscitation. Currently compensated. 2-D echo from 07/28/2008 with a EF of 55-60% with grade 1 diastolic dysfunction. Patient is status post PPM. Daughter describes bradycardia/some type of heart block as reason for PPM. Patient's IV fluids have been saline locked. Patient is currently compensated. Patient is -1.607 L during this hospitalization. Patient looks clinically dry and a such will hold patient's Lasix. Gentle hydration.  Outpatient follow-up.  #4 mild hypoglycemia Secondary to nothing by mouth  status and poor oral intake. Patient currently on a diet. Follow.  #5 chronic pain Continue current pain regimen. Resumed patient's home regimen of Percocet.  #6 chronic kidney disease stage IV Baseline creatinine 2.1-2.4. Currently close to baseline. Follow.  #7 hyperlipidemia Continue statin.  #8 hypokalemia Secondary to diuretics. Replete.  #9 and deficiency anemia/anemia of chronic disease. B-12 levels of 1799. RBC folate within normal limits. Iron saturation was 4%. Ferritin of 65. Patient status post 1 dose of IV iron. Patient will need to be started on oral iron at time of discharge.  #10 deconditioning/generalized weakness Patient has been seen by PT. PT recommending skilled nursing facility. Patient hesitant to go to a skilled nursing facility and wants to go back home. Patient complaining of significant weakness now and is agreeable to go to a skilled nursing facility. Will inform Education officer, museum.   #11 oral thrush Patient noted to have some thrush on the left side of his tongue. Diflucan D2/7.  #12. Probable urinary retention Will check a bladder scan. If greater than 300 mL place a Foley catheter and add low-dose Flomax. Patient will likely need urology outpatient follow-up for voiding trial.  #13 prophylaxis SCDs for DVT prophylaxis.  Code Status: DO NOT RESUSCITATE Family Communication: Updated patient and daughters at bedside. Disposition Plan: Skilled nursing facility pending GI follow-up hopefully 1-2 days.   Consultants:  Gastroenterologist: Dr. [name] 09/27/2014  Procedures:  Chest x-ray 09/27/2014  2-D echo 09/30/2014  ERCP with sphincterotomy and stone extraction 10/04/2014 per Dr. Amedeo Plenty  Antibiotics:  IV Unasyn 09/27/2014>>>>> 10/02/2014  HPI/Subjective: Patient denies any further abdominal pain. No nausea or emesis. Patient states he is feeling very weak and can't even stand. Patient c/o urinary retention. Patient agreeable to SNF. Patient  states has not eaten today as he does not feel hungry.  Objective: Filed Vitals:   10/05/14 0541  BP: 125/57  Pulse: 74  Temp: 98.8 F (37.1 C)  Resp: 16    Intake/Output Summary (Last 24 hours) at 10/05/14 1104 Last data filed at 10/05/14 0900  Gross per 24 hour  Intake    920 ml  Output    575 ml  Net    345 ml   Filed Weights   10/03/14 0559 10/04/14 0537 10/05/14 0541  Weight: 68.3 kg (150 lb 9.2 oz) 68.6 kg (151 lb 3.8 oz) 71.4 kg (157 lb 6.5 oz)    Exam:   General:  NAD  Cardiovascular: RRR  Respiratory: CTAB  Abdomen: Soft, nontender, nondistended, positive bowel sounds.  Musculoskeletal: No clubbing cyanosis or edema.  Data Reviewed: Basic Metabolic Panel:  Recent Labs Lab 10/01/14 0320 10/02/14 0350 10/03/14 0321 10/04/14 1145 10/05/14 0525  NA 139 139 140 141 138  K 3.5 3.9 5.4* 3.3* 3.5  CL 101 101 102 101 101  CO2 _0 GLUCOSE 95 106* 102* 103* 125*  BUN 30* 35* 37* 30* 27*  CREATININE 1.80* 1.77* 1.83* 1.99* 2.02*  CALCIUM 9.2 9.5 9.0 9.0 8.9  MG 2.0 2.0  --   --   --    Liver Function Tests:  Recent Labs Lab 10/01/14 0320 10/02/14 0350 10/03/14 0321 10/04/14 1145 10/05/14 0525  AST 24 25 52* 25 23  ALT 63 47 39 31 26  ALKPHOS 152* 144* 132* 133* 132*  BILITOT 1.2 0.7 2.0* 1.0 1.0  PROT 5.4* 5.6* 5.0* 5.6* 5.6*  ALBUMIN 2.8* 2.9* 2.7* 2.9* 2.7*    Recent Labs Lab 10/01/14 0320 10/02/14 0350  10/03/14 0321 10/04/14 1145 10/05/14 0525  LIPASE 115* 278* 588* 122* 515*   No results for input(s): AMMONIA in the last 168 hours. CBC:  Recent Labs Lab 09/30/14 0433 10/02/14 0350 10/03/14 0533  WBC 5.7 6.9 6.2  NEUTROABS 4.0  --  4.5  HGB 10.5* 11.2* 10.2*  HCT 35.6* 36.3* 33.3*  MCV 84.8 81.8 82.6  PLT 165 155 129*   Cardiac Enzymes: No results for input(s): CKTOTAL, CKMB, CKMBINDEX, TROPONINI in the last 168 hours. BNP (last 3 results) No results for input(s): BNP in the last 8760 hours.  ProBNP  (last 3 results) No results for input(s): PROBNP in the last 8760 hours.  CBG:  Recent Labs Lab 10/04/14 0751 10/04/14 1254 10/04/14 1732 10/04/14 2114 10/05/14 0738  GLUCAP 103* 105* 122* 173* 122*    Recent Results (from the past 240 hour(s))  Urine culture     Status: None   Collection Time: 09/27/14  2:15 PM  Result Value Ref Range Status   Specimen Description URINE, RANDOM  Final   Special Requests NONE  Final   Culture NO GROWTH 1 DAY  Final   Report Status 09/28/2014 FINAL  Final     Studies: Dg Ercp Biliary & Pancreatic Ducts  10/04/2014   CLINICAL DATA:  Common bile duct stone.  EXAM: ERCP  TECHNIQUE: Multiple spot images obtained with the fluoroscopic device and submitted for interpretation post-procedure.  FLUOROSCOPY TIME:  2 minutes, 27 seconds  COMPARISON:  CT abdomen pelvis -09/26/2007  FINDINGS: Two spot intraoperative fluoroscopic images of the right upper abdominal quadrant during ERCP are provided for review.  Initial image demonstrates an ERCP probe overlying the right upper abdominal quadrant. Surgical clips overlie the expected location of the gallbladder fossa. There is selective cannulation and opacification of the CBD which appears at least moderately dilated. There are no definitive opacities seen with the opacified portions of the biliary tree, however adjacent to the medial aspect of the insertion of the CBD are 2 discrete opacities which may correlate with the stones seen regional to the pancreatic head on preceding abdominal CT.  Subsequent image demonstrates partial retract of the ERCP probe with a minimal amount of residual contrast within the CBD. The adjacent discrete opacities appear unchanged in position/location.  There is no definitive opacification of the pancreatic duct.  IMPRESSION: 1. ERCP with persistent findings of at least moderate dilatation of the CBD. 2. While there is no definitive evidence of choledocholithiasis, there are 2 discrete  opacities adjacent to the medial aspect of the insertion of CBD which likely correlate with the laminated calcifications seen regional to the pancreatic head on preceding noncontrast abdominal CT. Correlation the operative report is recommended. These images were submitted for radiologic interpretation only. Please see the procedural report for the amount of contrast and the fluoroscopy time utilized.   Electronically Signed   By: Sandi Mariscal M.D.   On: 10/04/2014 10:21    Scheduled Meds: . antiseptic oral rinse  7 mL Mouth Rinse q12n4p  . chlorhexidine  15 mL Mouth Rinse BID  . famotidine  20 mg Oral Daily  . fluconazole  100 mg Oral Daily  . metoprolol succinate  50 mg Oral Daily  . potassium chloride  40 mEq Oral Once  . simvastatin  20 mg Oral q1800   Continuous Infusions: . sodium chloride      Principal Problem:   Pancreatitis, gallstone Active Problems:   Pressure ulcer   Choledocholithiasis   Cholecystitis, acute  Anemia, iron deficiency   Hyperglycemia   Chronic kidney disease (CKD), stage IV (severe)   Hypokalemia   Chronic diastolic CHF (congestive heart failure)   Pancreatitis, acute   Acute on chronic diastolic CHF (congestive heart failure)   Oral thrush    Time spent: 49 minutes    Brennen Camper M.D. [facility] Pager [phone]. If 7PM-7AM, please contact night-coverage at [email or web address], password [facility] 10/05/2014, 11:04 AM  LOS: 8 days

## 2014-10-05 NOTE — Progress Notes (Signed)
[facility] PROGRESS NOTE Subjective patient had some abdominal pain and nausea after his ERCP and stone extraction yesterday. Initially, his lipase it improved until we started feeding them and then it bumped back up leading to ERCP with removal of sludge and stone material from the CBD. Lipase is bumped back up to 580 and the patient still feels a bit queasy.  Objective: Vital signs in last 24 hours: Temp:  [97.8 F (36.6 C)-99.2 F (37.3 C)] 98.8 F (37.1 C) (08/28 0541) Pulse Rate:  [60-74] 74 (08/28 0541) Resp:  [16] 16 (08/28 0541) BP: (122-125)/(51-62) 125/57 mmHg (08/28 0541) SpO2:  [95 %-100 %] 95 % (08/28 0541) Weight:  [71.4 kg (157 lb 6.5 oz)] 71.4 kg (157 lb 6.5 oz) (08/28 0541) Last BM Date: 09/28/14  Intake/Output from previous day: 08/27 0701 - 08/28 0700 In: 1220 [P.O.:720; I.V.:500] Out: 575 [Urine:575] Intake/Output this shift: Total I/O In: 400 [P.O.:400] Out: 30 [Urine:30]  PE: General-- no acute distress  Abdomen-- nondistended and fairly soft  Lab Results:  Recent Labs  10/03/14 0533  WBC 6.2  HGB 10.2*  HCT 33.3*  PLT 129*   BMET  Recent Labs  10/03/14 0321 10/04/14 1145 10/05/14 0525  NA 140 141 138  K 5.4* 3.3* 3.5  CL 102 101 101  CO2 CREATININE 1.83* 1.99* 2.02*   LFT  Recent Labs  10/03/14 0321 10/04/14 1145 10/05/14 0525  PROT 5.0* 5.6* 5.6*  AST 52* 25 23  ALT 39 31 26  ALKPHOS 132* 133* 132*  BILITOT 2.0* 1.0 1.0   PT/INR No results for input(s): LABPROT, INR in the last 72 hours. PANCREAS  Recent Labs  10/03/14 0321 10/04/14 1145 10/05/14 0525  LIPASE 588* 122* 515*         Studies/Results: Dg Ercp Biliary & Pancreatic Ducts  10/04/2014   CLINICAL DATA:  Common bile duct stone.  EXAM: ERCP  TECHNIQUE: Multiple spot images obtained with the fluoroscopic device and submitted for interpretation post-procedure.  FLUOROSCOPY TIME:  2 minutes, 27 seconds  COMPARISON:  CT abdomen  pelvis -09/26/2007  FINDINGS: Two spot intraoperative fluoroscopic images of the right upper abdominal quadrant during ERCP are provided for review.  Initial image demonstrates an ERCP probe overlying the right upper abdominal quadrant. Surgical clips overlie the expected location of the gallbladder fossa. There is selective cannulation and opacification of the CBD which appears at least moderately dilated. There are no definitive opacities seen with the opacified portions of the biliary tree, however adjacent to the medial aspect of the insertion of the CBD are 2 discrete opacities which may correlate with the stones seen regional to the pancreatic head on preceding abdominal CT.  Subsequent image demonstrates partial retract of the ERCP probe with a minimal amount of residual contrast within the CBD. The adjacent discrete opacities appear unchanged in position/location.  There is no definitive opacification of the pancreatic duct.  IMPRESSION: 1. ERCP with persistent findings of at least moderate dilatation of the CBD. 2. While there is no definitive evidence of choledocholithiasis, there are 2 discrete opacities adjacent to the medial aspect of the insertion of CBD which likely correlate with the laminated calcifications seen regional to the pancreatic head on preceding noncontrast abdominal CT. Correlation the operative report is recommended. These images were submitted for radiologic interpretation only. Please see the procedural report for the amount of contrast and the fluoroscopy time utilized.   Electronically Signed   By: Holland Commons.D.  On: 10/04/2014 10:21    Medications: I have reviewed the patient's current medications.  Assessment/Plan: 1. Acute pancreatitis. Probably biliary. Sludge removed from CBD and sphincterotomy performed yesterday. Having some increasing symptoms. The procedure may have flared up his pancreatitis. Put him back on ice chips only and repeat labs in the  morning   Krystol Rocco JR,Lumen Brinlee L 10/05/2014, 12:10 PM  Pager: [phone] If no answer or after hours call [phone]

## 2014-10-05 NOTE — Progress Notes (Signed)
Pt dribbled 30 cc urine, bladder scan 404 ml, Dr. Janee Morn notified, order received to insert foley.

## 2014-10-06 LAB — COMPREHENSIVE METABOLIC PANEL
ALT: 18 U/L (ref 17–63)
AST: 18 U/L (ref 15–41)
Albumin: 2.4 g/dL — ABNORMAL LOW (ref 3.5–5.0)
Alkaline Phosphatase: 101 U/L (ref 38–126)
Anion gap: 8 (ref 5–15)
BUN: 27 mg/dL — ABNORMAL HIGH (ref 6–20)
CHLORIDE: 102 mmol/L (ref 101–111)
CO2: 27 mmol/L (ref 22–32)
Calcium: 8.3 mg/dL — ABNORMAL LOW (ref 8.9–10.3)
Creatinine, Ser: 1.88 mg/dL — ABNORMAL HIGH (ref 0.61–1.24)
GFR, EST AFRICAN AMERICAN: 36 mL/min — AB (ref >60)
GFR, EST NON AFRICAN AMERICAN: 31 mL/min — AB (ref >60)
Glucose, Bld: 85 mg/dL (ref 65–99)
POTASSIUM: 3.5 mmol/L (ref 3.5–5.1)
Sodium: 137 mmol/L (ref 135–145)
Total Bilirubin: 1 mg/dL (ref 0.3–1.2)
Total Protein: 5.1 g/dL — ABNORMAL LOW (ref 6.5–8.1)

## 2014-10-06 LAB — GLUCOSE, CAPILLARY
GLUCOSE-CAPILLARY: 81 mg/dL (ref 65–99)
GLUCOSE-CAPILLARY: 70 mg/dL (ref 65–99)
GLUCOSE-CAPILLARY: 112 mg/dL — AB (ref 65–99)
GLUCOSE-CAPILLARY: 94 mg/dL (ref 65–99)

## 2014-10-06 LAB — CBC
HEMATOCRIT: 31.6 % — AB (ref 39.0–52.0)
HEMOGLOBIN: 9.5 g/dL — AB (ref 13.0–17.0)
MCH: 25.7 pg — ABNORMAL LOW (ref 26.0–34.0)
MCHC: 30.1 g/dL (ref 30.0–36.0)
MCV: 85.4 fL (ref 78.0–100.0)
Platelets: 168 10*3/uL (ref 150–400)
RBC: 3.7 MIL/uL — ABNORMAL LOW (ref 4.22–5.81)
RDW: 17.4 % — AB (ref 11.5–15.5)
WBC: 6.2 10*3/uL (ref 4.0–10.5)

## 2014-10-06 LAB — LIPASE, BLOOD: LIPASE: 292 U/L — AB (ref 22–51)

## 2014-10-06 MED ORDER — POTASSIUM CHLORIDE CRYS ER 20 MEQ PO TBCR
40.0000 meq | EXTENDED_RELEASE_TABLET | Freq: Once | ORAL | Status: AC
  Administered 2014-10-06: 40 meq via ORAL
  Filled 2014-10-06: qty 2

## 2014-10-06 NOTE — Progress Notes (Signed)
[facility] PROGRESS NOTE  Josemaria Brining HMC:[id number] DOB: Apr 16, 1925 DOA: 09/27/2014 PCP: [name], MD  Brief history 81 year old male with a history of CKD stage IV, left-sided nephrectomy for renal cell carcinoma, hypertension, COPD, cardiomyopathy, and hyperlipidemia presented to [facility] ED with 24 hours of upper abdominal pain that was worse with meals. CT of the abdomen and pelvis at [facility] revealed small calcified structures of the head of the pancreas which may possibly be in the distal common bile duct near the ampulla. There was also a tiny amount of pneumobilia. The patient had cholecystectomy over 10 years ago. He was noted to have WBC 15.5, AST 628, ALT 405, alk phosphatase 294, total bilirubin 2.8, lipase 3927. However, the pancreas did not have any inflammatory changes on CT. Because of concerns of cholangitis and choledocholithiasis, the patient was transferred to [facility] for possible ERCP. [facility] has been consulted-->conservative tx for now. Patient was improving clinically and was started on a diet. After patient was started on a low-fat diet patient did have some nausea however abdominal pain had improved. LFTs were noted to be elevated and a significant rise in lipase to 588 and as such patient's underwent ERCP with sphincterotomy and stone extraction 10/04/2014 per GI. Patient's lipase went back up to 580 with complaints of nausea and a such patient was placed back bowel rest with ice chips.  Assessment/Plan: #1 acute pancreatitis/hyperbilirubinemia/transaminitis Likely secondary to common bile duct sludge and small stones per ERCP with sphincterotomy and stone extraction 10/04/2014. Patient with clinical improvement.  Lipase levels are back trending back down again to 292 from 515 yesterday and may be secondary to post ERCP. Patient denies any abdominal pain. Patient currently tolerating clears.  Patient with prior history of cholecystectomy as noted per CT of  the abdomen the [facility]. Patient daughter stated that patient had biliary pancreatitis approximately 15-20 years ago when he had his cholecystectomy. Patient has been seen in consultation by Dr. [name] of gastroenterology who feels that the possible explanation for large calcifications which estimated to be a centimeter in size are: Calcifications in the head of the pancreas, pedal fragments trapped in the duodenal diverticulum, or large CBD/PD stone. GI did not feel patient needed urgent ERCP initially on admission. Patient has tolerated clear liquid diet which was started on 09/30/2014. D/c'd IV Unasyn. Patient's diet had been advanced to a low-fat diet per GI which patient tolerated. Comprehensive metabolic profile with elevated LFTs and lipase levels have risen significantly to 515 post procedure from 122 from 588 from 278 from 115 the day before. Bilirubin is normalized. Elevated lipase may be secondary to ERCP.  Due to significant elevation in lipase and concern for gallstone pancreatitis patient underwent  ERCP  with sphincterotomy and stone extraction 10/04/2014. On ERCP patient was noted to have, bile duct sludge and small stones which were removed after sphincterotomy. Patient's diet has been advanced per GI. Lipase levels yesterday morning elevated at 515 again, and patient had complaints of nausea and a such he was placed on bowel rest with ice chips only. Lipase levels trending back down today. Patient asymptomatic. Patient has been started on a trial of clears with GI. Repeat labs in the morning.  #2 hypertension Stable. Continue metoprolol.  #3 acute on chronic diastolic CHF Likely secondary to fluid resuscitation. Currently compensated. 2-D echo from 07/28/2008 with a EF of 55-60% with grade 1 diastolic dysfunction. Patient is status post PPM. Daughter describes bradycardia/some type of heart block as reason for PPM.  Patient's IV fluids have been saline locked. Patient is currently  compensated. Patient is -1.607 L during this hospitalization. Patient looks clinically dry and Lasix has been on hold. Continue gentle hydration. Will likely resume home dose oral Lasix in a few days.   #4 mild hypoglycemia Secondary to nothing by mouth status and poor oral intake. Patient currently on a diet. Follow.  #5 chronic pain Continue current pain regimen. Resumed patient's home regimen of Percocet.  #6 chronic kidney disease stage IV Baseline creatinine 2.1-2.4. Currently close to baseline. Follow.  #7 hyperlipidemia Continue statin.  #8 hypokalemia Secondary to diuretics. Replete.  #9 and deficiency anemia/anemia of chronic disease. B-12 levels of 1799. RBC folate within normal limits. Iron saturation was 4%. Ferritin of 65. Patient status post 1 dose of IV iron. Patient will need to be started on oral iron at time of discharge.  #10 deconditioning/generalized weakness Patient has been seen by PT. PT recommending skilled nursing facility. Patient hesitant to go to a skilled nursing facility and wants to go back home. Patient today requesting to try a home health therapies. Will inform case manager. Once ready for discharge will discharge home with home health therapies.   #11 oral thrush Patient noted to have some thrush on the left side of his tongue. Diflucan D3/7.  #12. Probable urinary retention Continue Foley catheter and low-dose Flomax. Patient will likely need urology outpatient follow-up for voiding trial.  #13 prophylaxis SCDs for DVT prophylaxis.  Code Status: DO NOT RESUSCITATE Family Communication: Updated patient and daughter at bedside. Disposition Plan: Home with home health versus Skilled nursing facility pending GI follow-up hopefully 1-2 days.   Consultants:  Gastroenterologist: Dr. [name] 09/27/2014  Procedures:  Chest x-ray 09/27/2014  2-D echo 09/30/2014  ERCP with sphincterotomy and stone extraction 10/04/2014 per Dr.  Amedeo Plenty  Antibiotics:  IV Unasyn 09/27/2014>>>>> 10/02/2014  HPI/Subjective: Patient denies any further abdominal pain. No nausea or emesis. Patient states he is feeling better. Patient tolerated clears. Patient asking for solid diet.   Objective: Filed Vitals:   10/06/14 1459  BP: 119/52  Pulse: 59  Temp: 98.2 F (36.8 C)  Resp: 18    Intake/Output Summary (Last 24 hours) at 10/06/14 1526 Last data filed at 10/06/14 1320  Gross per 24 hour  Intake 1768.75 ml  Output   1125 ml  Net 643.75 ml   Filed Weights   10/04/14 0537 10/05/14 0541 10/06/14 0531  Weight: 68.6 kg (151 lb 3.8 oz) 71.4 kg (157 lb 6.5 oz) 71.124 kg (156 lb 12.8 oz)    Exam:   General:  NAD  Cardiovascular: RRR  Respiratory: CTAB  Abdomen: Soft, nontender, nondistended, positive bowel sounds.  Musculoskeletal: No clubbing cyanosis or edema.  Data Reviewed: Basic Metabolic Panel:  Recent Labs Lab 10/01/14 0320 10/02/14 0350 10/03/14 0321 10/04/14 1145 10/05/14 0525 10/06/14 0244  NA 139 139 140 141 138 137  K 3.5 3.9 5.4* 3.3* 3.5 3.5  CL 101 101 102 101 101 102  CO2 27 26 26 30 27 27   GLUCOSE 95 106* 102* 103* 125* 85  BUN 30* 35* 37* 30* 27* 27*  CREATININE 1.80* 1.77* 1.83* 1.99* 2.02* 1.88*  CALCIUM 9.2 9.5 9.0 9.0 8.9 8.3*  MG 2.0 2.0  --   --   --   --    Liver Function Tests:  Recent Labs Lab 10/02/14 0350 10/03/14 0321 10/04/14 1145 10/05/14 0525 10/06/14 0244  AST 25 52* 25 23 18   ALT 47 39 31  26 18  ALKPHOS 144* 132* 133* 132* 101  BILITOT 0.7 2.0* 1.0 1.0 1.0  PROT 5.6* 5.0* 5.6* 5.6* 5.1*  ALBUMIN 2.9* 2.7* 2.9* 2.7* 2.4*    Recent Labs Lab 10/02/14 0350 10/03/14 0321 10/04/14 1145 10/05/14 0525 10/06/14 0244  LIPASE 278* 588* 122* 515* 292*   No results for input(s): AMMONIA in the last 168 hours. CBC:  Recent Labs Lab 09/30/14 0433 10/02/14 0350 10/03/14 0533 10/06/14 0244  WBC 5.7 6.9 6.2 6.2  NEUTROABS 4.0  --  4.5  --   HGB 10.5* 11.2*  10.2* 9.5*  HCT 35.6* 36.3* 33.3* 31.6*  MCV 84.8 81.8 82.6 85.4  PLT 165 155 129* 168   Cardiac Enzymes: No results for input(s): CKTOTAL, CKMB, CKMBINDEX, TROPONINI in the last 168 hours. BNP (last 3 results) No results for input(s): BNP in the last 8760 hours.  ProBNP (last 3 results) No results for input(s): PROBNP in the last 8760 hours.  CBG:  Recent Labs Lab 10/05/14 0738 10/05/14 1218 10/05/14 1705 10/06/14 0724 10/06/14 1256  GLUCAP 122* 112* 95 81 70    Recent Results (from the past 240 hour(s))  Urine culture     Status: None   Collection Time: 09/27/14  2:15 PM  Result Value Ref Range Status   Specimen Description URINE, RANDOM  Final   Special Requests NONE  Final   Culture NO GROWTH 1 DAY  Final   Report Status 09/28/2014 FINAL  Final     Studies: No results found.  Scheduled Meds: . antiseptic oral rinse  7 mL Mouth Rinse q12n4p  . chlorhexidine  15 mL Mouth Rinse BID  . famotidine  20 mg Oral Daily  . fluconazole  100 mg Oral Daily  . metoprolol succinate  50 mg Oral Daily  . potassium chloride  40 mEq Oral Once  . simvastatin  20 mg Oral q1800  . tamsulosin  0.4 mg Oral QPC supper   Continuous Infusions:    Principal Problem:   Pancreatitis, gallstone Active Problems:   Pressure ulcer   Choledocholithiasis   Cholecystitis, acute   Anemia, iron deficiency   Hyperglycemia   Chronic kidney disease (CKD), stage IV (severe)   Hypokalemia   Chronic diastolic CHF (congestive heart failure)   Pancreatitis, acute   Acute on chronic diastolic CHF (congestive heart failure)   Oral thrush   Common bile duct stone    Time spent: 35 minutes    Meelah Tallo M.D. [facility] Pager [phone]. If 7PM-7AM, please contact night-coverage at [email or web address], password [facility] 10/06/2014, 3:26 PM  LOS: 9 days

## 2014-10-06 NOTE — Progress Notes (Signed)
Occupational Therapy Evaluation Patient Details Name: Brandon Floyd MRN: [id number] DOB: 11-21-1925 Today's Date: 10/06/2014    History of Present Illness 81 yo male with onset of abd pain from pancreatitis, has acute CHF and fall recently   Clinical Impression   Patient presents to OT with decreased ADL independence and safety. Will benefit from OT to maximize function and to facilitate a safe discharge plan. OT will follow.    Follow Up Recommendations  SNF;Supervision/Assistance - 24 hour    Equipment Recommendations  Other (comment) (tbd at SNF)    Recommendations for Other Services       Precautions / Restrictions Precautions Precautions: Fall Restrictions Weight Bearing Restrictions: No      Mobility Bed Mobility Overal bed mobility: Needs Assistance Bed Mobility: Sit to Supine     Supine to sit: Supervision     General bed mobility comments: increased time  Transfers Overall transfer level: Needs assistance Equipment used: Rolling walker (2 wheeled) Transfers: Sit to/from Stand Sit to Stand: Min guard         General transfer comment: vc's for safe hand placement    Balance Overall balance assessment: Needs assistance Sitting-balance support: Feet supported Sitting balance-Leahy Scale: Fair   Postural control: Posterior lean Standing balance support: Bilateral upper extremity supported Standing balance-Leahy Scale: Poor                              ADL Overall ADL's : Needs assistance/impaired     Grooming: Wash/dry hands;Wash/dry face;Oral care;Minimal assistance;Sitting   Upper Body Bathing: Moderate assistance;Sitting   Lower Body Bathing: Maximal assistance;Sit to/from stand                       Functional mobility during ADLs: Min guard;Rolling walker;Cueing for safety General ADL Comments: Patient up in recliner upon arrival. Wanted to get back to bed at end of session. Daughter present during session. Patient  reports he has a chair in his bathroom and normally performs grooming activities from a seated position. Patient is usually independent. Uses RW normally and goes out with friends for breakfast and dinner. Daughter assists with IADLs and medication management.      Vision     Perception     Praxis      Pertinent Vitals/Pain Pain Assessment: 0-10 Pain Score: 8  Pain Location: bilateral upper extremities Pain Descriptors / Indicators: Aching;Sore Pain Intervention(s): Limited activity within patient's tolerance;Monitored during session     Hand Dominance Right   Extremity/Trunk Assessment Upper Extremity Assessment Upper Extremity Assessment: Generalized weakness   Lower Extremity Assessment Lower Extremity Assessment: Defer to PT evaluation       Communication Communication Communication: HOH   Cognition Arousal/Alertness: Awake/alert Behavior During Therapy: WFL for tasks assessed/performed Overall Cognitive Status: Within Functional Limits for tasks assessed                     General Comments       Exercises       Shoulder Instructions      Home Living Family/patient expects to be discharged to:: Skilled nursing facility Living Arrangements: Children Available Help at Discharge: Family;Available 24 hours/day Type of Home: House Home Access: Ramped entrance     Home Layout: One level         Bathroom Toilet: Standard     Home Equipment: Walker - 2 wheels;Bedside commode;Shower seat;Electric scooter   Additional Comments:  also has button hook/zipper pull      Prior Functioning/Environment Level of Independence: Independent with assistive device(s)        Comments: usually drives and goes out to breakfast and dinner with friends    OT Diagnosis: Generalized weakness   OT Problem List: Decreased strength;Decreased activity tolerance;Impaired balance (sitting and/or standing);Impaired vision/perception;Decreased knowledge of use of DME  or AE;Pain   OT Treatment/Interventions: Self-care/ADL training;Therapeutic exercise;DME and/or AE instruction;Therapeutic activities;Patient/family education    OT Goals(Current goals can be found in the care plan section) Acute Rehab OT Goals Patient Stated Goal: rehab, then home OT Goal Formulation: With patient Time For Goal Achievement: 10/20/14 Potential to Achieve Goals: Good  OT Frequency: Min 2X/week   Barriers to D/C:            Co-evaluation              End of Session Equipment Utilized During Treatment: Rolling walker Nurse Communication: Mobility status  Activity Tolerance: Patient tolerated treatment well Patient left: in bed;with call bell/phone within reach;with family/visitor present   Time: 1610-9604 OT Time Calculation (min): 28 min Charges:  OT General Charges $OT Visit: 1 Procedure OT Evaluation $Initial OT Evaluation Tier I: 1 Procedure OT Treatments $Self Care/Home Management : 8-22 mins G-Codes:    Muslima Toppins A 2014/10/13, 11:11 AM

## 2014-10-06 NOTE — Care Management Note (Signed)
Case Management Note  Patient Details  Name: Brandon Floyd MRN: [id number] Date of Birth: 03/04/1925  Subjective/Objective:                    Action/Plan:  PT recommending home health .  Spoke with patient and his daughter [name] at bedside. They are in agreement with home health . [name] lives with patient and can provide 24 hour assistance / supervision .   Confirmed face sheet information. Patient deferred decision on home health agency to [facility]s will discuss with her sister before deciding. Will follow up this afternoon.    Expected Discharge Date:                 Expected Discharge Plan:  Home w Home Health Services  In-House Referral:     Discharge planning Services     Post Acute Care Choice:    Choice offered to:  Adult Children  DME Arranged:    DME Agency:     HH Arranged:  PT, OT, Nurse's Aide HH Agency:     Status of Service:  In process, will continue to follow  Medicare Important Message Given:  Yes-third notification given Date Medicare IM Given:    Medicare IM give by:    Date Additional Medicare IM Given:    Additional Medicare Important Message give by:     If discussed at Long Length of Stay Meetings, dates discussed:    Additional Comments:  Kingsley Plan, RN 10/06/2014, 11:09 AM

## 2014-10-06 NOTE — Progress Notes (Signed)
Physical Therapy Treatment Patient Details Name: Brandon Floyd MRN: [id number] DOB: [birth year] Today's Date: 10/06/2014    History of Present Illness 81 yo male with onset of abd pain from pancreatitis, has acute CHF and fall recently    PT Comments    Pt with improved ambulation tolerance but remains to have overall decrease endurance, quick onset of fatigue and increased falls risk. Pt remains to requires 24/7 assist for safe d/c home. HHPT recommended to improved overall strength and endurance to progress independence with mobility.  Follow Up Recommendations  Home health PT;Supervision/Assistance - 24 hour     Equipment Recommendations  None recommended by PT (pt has RW and electric w/c)    Recommendations for Other Services       Precautions / Restrictions Precautions Precautions: Fall Restrictions Weight Bearing Restrictions: No    Mobility  Bed Mobility Overal bed mobility: Needs Assistance Bed Mobility: Supine to Sit     Supine to sit: Supervision;HOB elevated     General bed mobility comments: definite use of bed rail, increased time  Transfers Overall transfer level: Needs assistance Equipment used: Rolling walker (2 wheeled) Transfers: Sit to/from Stand Sit to Stand: Min guard;Supervision         General transfer comment: v/c's for safe hand placement, pt pulling up from crossbar of walker and pushing up from bed  Ambulation/Gait Ambulation/Gait assistance: Min guard Ambulation Distance (Feet): 150 Feet (x1, 60 x1 after seated rest break) Assistive device: Rolling walker (2 wheeled) Gait Pattern/deviations: Step-through pattern;Decreased stride length;Shuffle;Narrow base of support;Antalgic Gait velocity: slower Gait velocity interpretation: Below normal speed for age/gender General Gait Details: pt with quick onset of fatigue, freq standing rest breaks. required 3 min seated rest break s/p 150' . increased trunk flexion. noted R LE instability with  increased ambulation distance/onset of fatigue   Stairs            Wheelchair Mobility    Modified Rankin (Stroke Patients Only)       Balance Overall balance assessment: Needs assistance Sitting-balance support: Feet supported Sitting balance-Leahy Scale: Fair   Postural control: Posterior lean Standing balance support: Bilateral upper extremity supported Standing balance-Leahy Scale: Poor                      Cognition Arousal/Alertness: Awake/alert Behavior During Therapy: WFL for tasks assessed/performed Overall Cognitive Status:  (mildly confused)                      Exercises      General Comments        Pertinent Vitals/Pain Pain Assessment: No/denies pain    Home Living                      Prior Function            PT Goals (current goals can now be found in the care plan section) Acute Rehab PT Goals Patient Stated Goal: go home Progress towards PT goals: Progressing toward goals    Frequency  Min 3X/week    PT Plan Current plan remains appropriate    Co-evaluation             End of Session Equipment Utilized During Treatment: Gait belt Activity Tolerance: Patient limited by fatigue Patient left: in chair;with call bell/phone within reach;with chair alarm set     Time: 4098-1191 PT Time Calculation (min) (ACUTE ONLY): 19 min  Charges:  $Gait Training: 8-22 mins  G CodesMarcene Brawn 10/06/2014, 9:29 AM   Lewis Shock, PT, DPT Pager #: [phone] Office #: [phone]

## 2014-10-06 NOTE — Progress Notes (Signed)
Subjective: No appetite. No abdominal pain. Less nausea.  Objective: Vital signs in last 24 hours: Temp:  [97.7 F (36.5 C)-98.6 F (37 C)] 97.9 F (36.6 C) (08/29 0531) Pulse Rate:  [59-86] 86 (08/29 0531) Resp:  [16] 16 (08/29 0531) BP: (117-118)/(54-59) 117/54 mmHg (08/29 0531) SpO2:  [95 %-99 %] 96 % (08/29 0531) Weight:  [71.124 kg (156 lb 12.8 oz)] 71.124 kg (156 lb 12.8 oz) (08/29 0531) Weight change: -0.276 kg (-9.7 oz) Last BM Date: 09/28/14  PE: GEN:  Elderly but NAD ABD:  Protuberant, soft, active bowel sounds  Lab Results: Lipase     Component Value Date/Time   LIPASE 292* 10/06/2014 0244   CBC    Component Value Date/Time   WBC 6.2 10/06/2014 0244   RBC 3.70* 10/06/2014 0244   HGB 9.5* 10/06/2014 0244   HCT 31.6* 10/06/2014 0244   HCT 30.9* 09/27/2014 1345   PLT 168 10/06/2014 0244   MCV 85.4 10/06/2014 0244   MCH 25.7* 10/06/2014 0244   MCHC 30.1 10/06/2014 0244   RDW 17.4* 10/06/2014 0244   LYMPHSABS 1.0 10/03/2014 0533   MONOABS 0.4 10/03/2014 0533   EOSABS 0.3 10/03/2014 0533   BASOSABS 0.0 10/03/2014 0533   CMP     Component Value Date/Time   NA 137 10/06/2014 0244   K 3.5 10/06/2014 0244   CL 102 10/06/2014 0244   CO2 27 10/06/2014 0244   GLUCOSE 85 10/06/2014 0244   BUN 27* 10/06/2014 0244   CREATININE 1.88* 10/06/2014 0244   CALCIUM 8.3* 10/06/2014 0244   PROT 5.1* 10/06/2014 0244   ALBUMIN 2.4* 10/06/2014 0244   AST 18 10/06/2014 0244   ALT 18 10/06/2014 0244   ALKPHOS 101 10/06/2014 0244   BILITOT 1.0 10/06/2014 0244   GFRNONAA 31* 10/06/2014 0244   GFRAA 36* 10/06/2014 0244   Assessment:  1.  Pancreatitis, possible CBC stone-mediated, s/p ERCP with sphincterotomy and stone/sludge extraction. 2.  Recurrent elevation lipase, possible component of post-ERCP pancreatitis, but patient has no clinical symptoms supportive of pancreatitis at the present time. 3.  Failure to thrive, possibly from #1/#2 above.  Plan:  1.  Trial  of clear liquid diet. 2.  OOBTC, mobilize patient. 3.  Minimize narcotic medications. 4.  Discussed case at bedside with patient and his daughter. 5.  [facility] will follow.   Freddy Jaksch 10/06/2014, 10:42 AM   Pager [phone] If no answer or after 5 PM call [phone]

## 2014-10-06 NOTE — Care Management Important Message (Signed)
Important Message  Patient Details  Name: Brandon Floyd MRN: [id number] Date of Birth: 04/10/1925   Medicare Important Message Given:  Yes-third notification given    Orson Aloe 10/06/2014, 10:20 AM

## 2014-10-07 ENCOUNTER — Encounter (HOSPITAL_COMMUNITY): Payer: Self-pay | Admitting: Gastroenterology

## 2014-10-07 LAB — LIPASE, BLOOD: LIPASE: 184 U/L — AB (ref 22–51)

## 2014-10-07 LAB — COMPREHENSIVE METABOLIC PANEL
ALK PHOS: 100 U/L (ref 38–126)
ALT: 17 U/L (ref 17–63)
ANION GAP: 8 (ref 5–15)
AST: 19 U/L (ref 15–41)
Albumin: 2.4 g/dL — ABNORMAL LOW (ref 3.5–5.0)
BUN: 21 mg/dL — ABNORMAL HIGH (ref 6–20)
CALCIUM: 8.6 mg/dL — AB (ref 8.9–10.3)
CO2: 26 mmol/L (ref 22–32)
CREATININE: 1.71 mg/dL — AB (ref 0.61–1.24)
Chloride: 101 mmol/L (ref 101–111)
GFR, EST AFRICAN AMERICAN: 40 mL/min — AB (ref >60)
GFR, EST NON AFRICAN AMERICAN: 34 mL/min — AB (ref >60)
Glucose, Bld: 67 mg/dL (ref 65–99)
Potassium: 4 mmol/L (ref 3.5–5.1)
Sodium: 135 mmol/L (ref 135–145)
Total Bilirubin: 1 mg/dL (ref 0.3–1.2)
Total Protein: 5 g/dL — ABNORMAL LOW (ref 6.5–8.1)

## 2014-10-07 LAB — GLUCOSE, CAPILLARY
GLUCOSE-CAPILLARY: 77 mg/dL (ref 65–99)
GLUCOSE-CAPILLARY: 105 mg/dL — AB (ref 65–99)

## 2014-10-07 MED ORDER — TAMSULOSIN HCL 0.4 MG PO CAPS: 0.4000 mg | ORAL_CAPSULE | Freq: Every day | ORAL | Status: AC

## 2014-10-07 MED ORDER — FUROSEMIDE 40 MG PO TABS: 40.0000 mg | ORAL_TABLET | Freq: Two times a day (BID) | ORAL | Status: AC

## 2014-10-07 MED ORDER — FLUCONAZOLE 100 MG PO TABS: 100.0000 mg | ORAL_TABLET | Freq: Every day | ORAL | Status: AC

## 2014-10-07 MED ORDER — ASPIRIN EC 81 MG PO TBEC: 81.0000 mg | DELAYED_RELEASE_TABLET | Freq: Every day | ORAL | Status: AC

## 2014-10-07 NOTE — Progress Notes (Signed)
Patient discharged home with instructions given to his daughters. Patient is going home with a foley catheter, hooked to a leg bag, family instructed foley care, verbalized understanding.

## 2014-10-07 NOTE — Progress Notes (Signed)
Subjective: Tolerating diet. No abdominal pain.  Objective: Vital signs in last 24 hours: Temp:  [97.6 F (36.4 C)-98.6 F (37 C)] 97.6 F (36.4 C) (08/30 0613) Pulse Rate:  [59-62] 62 (08/30 0613) Resp:  [18] 18 (08/30 0613) BP: (119-138)/(52-59) 138/59 mmHg (08/30 0613) SpO2:  [97 %-98 %] 97 % (08/30 0613) Weight:  [68.7 kg (151 lb 7.3 oz)] 68.7 kg (151 lb 7.3 oz) (08/30 0613) Weight change: -2.424 kg (-5 lb 5.5 oz) Last BM Date: 10/05/14  PE: GEN:  NAD, elderly ABD:  Soft, protuberant, active bowel sounds  Lab Results: CBC    Component Value Date/Time   WBC 6.2 10/06/2014 0244   RBC 3.70* 10/06/2014 0244   HGB 9.5* 10/06/2014 0244   HCT 31.6* 10/06/2014 0244   HCT 30.9* 09/27/2014 1345   PLT 168 10/06/2014 0244   MCV 85.4 10/06/2014 0244   MCH 25.7* 10/06/2014 0244   MCHC 30.1 10/06/2014 0244   RDW 17.4* 10/06/2014 0244   LYMPHSABS 1.0 10/03/2014 0533   MONOABS 0.4 10/03/2014 0533   EOSABS 0.3 10/03/2014 0533   BASOSABS 0.0 10/03/2014 0533   CMP     Component Value Date/Time   NA 135 10/07/2014 0250   K 4.0 10/07/2014 0250   CL 101 10/07/2014 0250   CO2 26 10/07/2014 0250   GLUCOSE 67 10/07/2014 0250   BUN 21* 10/07/2014 0250   CREATININE 1.71* 10/07/2014 0250   CALCIUM 8.6* 10/07/2014 0250   PROT 5.0* 10/07/2014 0250   ALBUMIN 2.4* 10/07/2014 0250   AST 19 10/07/2014 0250   ALT 17 10/07/2014 0250   ALKPHOS 100 10/07/2014 0250   BILITOT 1.0 10/07/2014 0250   GFRNONAA 34* 10/07/2014 0250   GFRAA 40* 10/07/2014 0250   Assessment:  1.  Pancreatitis, ? Gallstone-mediated, clinically improving. 2.  Elevated LFTs, now normalized. 3.  Anemia.  No overt GI bleeding.  Plan:  1.  Low fat diet. 2.  OK to discharge home today from GI perspective; can follow-up re: pancreatitis and anemia with Dr. Madilyn Fireman [facility] [phone]) in 3-4 weeks. 3.  Case discussed with Dr. Janee Morn; will sign-off; please call with questions; thank you for the  consultation.   Freddy Jaksch 10/07/2014, 12:28 PM   Pager [phone] If no answer or after 5 PM call [phone]

## 2014-10-07 NOTE — Discharge Summary (Signed)
Physician Discharge Summary  Brandon Floyd IEP:[id number] DOB: 1925/06/27 DOA: 09/27/2014  PCP: [name], MD  Admit date: 09/27/2014 Discharge date: 10/07/2014  Time spent: 70 minutes  Recommendations for Outpatient Follow-up:  1. Follow-up with SCHULTZ,DOUGLAS E, MD in 1-2 weeks. On follow-up patient needs a basic metabolic profile done to follow-up on electrolytes and renal function. Patient blood pressure need to be reassessed. Patient's diuretics dosing need to be reassessed as it was decreased to 40 mg twice daily on discharge due to concerns for significant dehydration. 2. Follow-up with Dr. Amedeo Plenty, gastroenterology in 2-4 weeks.  3. Follow-up with urologist in 1 week to follow-up on urinary retention and voiding trial, further evaluation.  Discharge Diagnoses:  Principal Problem:   Pancreatitis, gallstone Active Problems:   Pressure ulcer   Choledocholithiasis   Cholecystitis, acute   Anemia, iron deficiency   Hyperglycemia   Chronic kidney disease (CKD), stage IV (severe)   Hypokalemia   Chronic diastolic CHF (congestive heart failure)   Pancreatitis, acute   Acute on chronic diastolic CHF (congestive heart failure)   Oral thrush   Common bile duct stone   Discharge Condition: Stable and improved  Diet recommendation: Heart healthy/soft diet  Filed Weights   10/05/14 0541 10/06/14 0531 10/07/14 0613  Weight: 71.4 kg (157 lb 6.5 oz) 71.124 kg (156 lb 12.8 oz) 68.7 kg (151 lb 7.3 oz)    History of present illness:  Per Dr Leanora Cover Kroft is a 81 y.o. male  With a history of hypertension, congestive heart failure, pacemaker, COPD, renal disease, partial left nephrectomy due to left kidney cancer per patient, who was transferred to [facility] due to abdominal pain. His onset of pain has been over the past 24 hours and has been worsening. His pain was located in the right upper quadrant, left upper quadrant, and epigastric areas. Pain was worse with  movement, palpation, eating. It is improved with pain medicines. He was evaluated at [facility] where he was discovered he had a lipase in the 3000 range along with a gallstone in the pancreatic duct. The ER physician discussed this patient with GI as there was not the capability of performing an ERCP at [facility]. The patient was extended for transfer and GI will consult.  From outside hospital: #1 CBC shows white count of 15.5, hemoglobin 10.6, hematocrit 32.8, platelets 161. #2 CMP shows sodium of 142, potassium 3.5, chloride 99, bicarbonate 27, BUN 64, creatinine 2.4, glucose 107, total bili 2.8, AST 628, ALT 405, alkaline phosphatase 294, #3 lactic acid 2.1 #4 proBNP 2210, troponin 0.05, creatinine kinase 77, myoglobin 270 #5 lipase 3927 #6 ABG H pH 7.470, PCO2 35.0, PO2 78.0, bicarbonate 25.5  Hospital Course:  Brief history 81 year old male with a history of CKD stage IV, left-sided nephrectomy for renal cell carcinoma, hypertension, COPD, cardiomyopathy, and hyperlipidemia presented to [facility] ED with 24 hours of upper abdominal pain that was worse with meals. CT of the abdomen and pelvis at [facility] revealed small calcified structures of the head of the pancreas which may possibly be in the distal common bile duct near the ampulla. There was also a tiny amount of pneumobilia. The patient had cholecystectomy over 10 years ago. He was noted to have WBC 15.5, AST 628, ALT 405, alk phosphatase 294, total bilirubin 2.8, lipase 3927. However, the pancreas did not have any inflammatory changes on CT. Because of concerns of cholangitis and choledocholithiasis, the patient was transferred to [facility] for possible ERCP. [facility] has  been consulted-->conservative tx for now. Patient was improving clinically and was started on a diet. After patient was started on a low-fat diet patient did have some nausea however abdominal pain had improved. LFTs were noted to be elevated and a significant  rise in lipase to 588 and as such patient's underwent ERCP with sphincterotomy and stone extraction 10/04/2014 per GI. Patient's lipase went back up to 580 with complaints of nausea and a such patient was placed back bowel rest with ice chips.  Assessment/Plan: #1 acute pancreatitis/hyperbilirubinemia/transaminitis Likely secondary to common bile duct sludge and small stones per ERCP with sphincterotomy and stone extraction 10/04/2014. Patient with prior history of cholecystectomy as noted per CT of the abdomen the [facility] Patient daughter stated that patient had biliary pancreatitis approximately 15-20 years ago when he had his cholecystectomy. Patient was seen in consultation by Dr. [name] of gastroenterology who feels that the possible explanation for large calcifications which estimated to be a centimeter in size are: Calcifications in the head of the pancreas, pedal fragments trapped in the duodenal diverticulum, or large CBD/PD stone. GI did not feel patient needed urgent ERCP initially on admission. Patient tolerated clear liquid diet which was started on 09/30/2014. Empiric IV antibiotics of Unasyn which was started were discontinued. Patient's diet had been advanced to a low-fat diet per GI which patient tolerated. Comprehensive metabolic profile with elevated LFTs and lipase levels had risen significantly to 588 from 278 from 115. Bilirubin normalized. Due to significant elevation in lipase and concern for gallstone pancreatitis patient underwent ERCP with sphincterotomy and stone extraction 10/04/2014. On ERCP patient was noted to have, bile duct sludge and small stones which were removed after sphincterotomy. Patient's diet has been advanced per GI. Lipase levels were back elevated at 515 again, and patient had complaints of nausea and as such he was placed on bowel rest with ice chips only. It was felt elevated lipase levels were secondary to ERCP. Patient improved clinically on  bowel rest lipase levels trended down. Patient was subsequently started on ice chips and then transition to clear liquids and then to soft diet which he tolerated. On day of discharge patient's lipase was down to 184 and patient was asymptomatic. Patient is to follow-up with GI as outpatient. Patient be discharged in stable and improved condition.   #2 hypertension Stable. Continued on metoprolol.  #3 acute on chronic diastolic CHF Likely secondary to fluid resuscitation. Currently compensated. 2-D echo from 07/28/2008 with a EF of 55-60% with grade 1 diastolic dysfunction. Patient is status post PPM. Daughter describes bradycardia/some type of heart block as reason for PPM. Patient's IV fluids have been saline locked. Patient is currently compensated. Patient is -1.607 L during this hospitalization. Patient looked dehydrated during the hospitalization a such patient's Lasix was held. Patient was hydrated gently was euvolemic by day of discharge. Patient will be resumed back on half his home dose of Lasix in 2-3 days post discharge. Outpatient follow-up.   #4 mild hypoglycemia Patient was noted to have a mild hypoglycemia during the hospitalization which was felt to be secondary to patient's bowel rest. Patient was monitored closely patient was started on a diet with resolution of hypoglycemia.   #5 chronic pain Patient was maintained on his home regimen of pain medications of Percocet.   #6 chronic kidney disease stage IV Baseline creatinine 2.1-2.4. Remain close to baseline throughout the hospitalization and on day of discharge was better than his baseline at 1.71. Outpatient follow-up.   #7 hyperlipidemia  Outpatient follow-up.  #8 hypokalemia Secondary to diuretics. Repleted.  #9 and deficiency anemia/anemia of chronic disease. B-12 levels of 1799. RBC folate within normal limits. Iron saturation was 4%. Ferritin of 65. Patient status post 1 dose of IV iron.   #10  deconditioning/generalized weakness Patient has been seen by PT. PT recommending skilled nursing facility. Patient hesitant to go to a skilled nursing facility and wants to go back home. Patient as such will be discharged home with home health therapies.  #11 oral thrush Patient noted to have some thrush on the left side of his tongue. Patient was started on Diflucan for discharge on 4 more days of oral Diflucan to complete a one-week course of therapy. Outpatient follow-up.   #12. Probable urinary retention Patient was noted to have urinary retention during the hospitalization a such Foley catheter was placed and patient was not a low-dose Flomax. Patient will need to follow-up with his urologist 1 week postdischarge for voiding trial and further evaluation.    Procedures:  Chest x-ray 09/27/2014  2-D echo 09/30/2014  ERCP with sphincterotomy and stone extraction 10/04/2014 per Dr. Amedeo Plenty    Consultations:  Gastroenterologist: Dr. [name] 09/27/2014  Discharge Exam: Filed Vitals:   10/07/14 0613  BP: 138/59  Pulse: 62  Temp: 97.6 F (36.4 C)  Resp: 18    General: NAD Cardiovascular: RRR Respiratory: CTAB  Discharge Instructions   Discharge Instructions    Diet - low sodium heart healthy    Complete by:  As directed      Discharge instructions    Complete by:  As directed   Follow up in 2-4 weeks with Dr Amedeo Plenty, GI     Increase activity slowly    Complete by:  As directed           Current Discharge Medication List    START taking these medications   Details  fluconazole (DIFLUCAN) 100 MG tablet Take 1 tablet (100 mg total) by mouth daily. Take for 4 days then stop. Qty: 4 tablet, Refills: 0    tamsulosin (FLOMAX) 0.4 MG CAPS capsule Take 1 capsule (0.4 mg total) by mouth daily after supper. Qty: 30 capsule, Refills: 0      CONTINUE these medications which have CHANGED   Details  aspirin EC 81 MG tablet Take 1 tablet (81 mg total) by mouth daily. Resume  in 3 days    furosemide (LASIX) 40 MG tablet Take 1 tablet (40 mg total) by mouth 2 (two) times daily. Resume in 3-4 days. Qty: 60 tablet, Refills: 0      CONTINUE these medications which have NOT CHANGED   Details  albuterol (PROVENTIL) (2.5 MG/3ML) 0.083% nebulizer solution Take 2.5 mg by nebulization 3 (three) times daily.    Cholecalciferol (VITAMIN D3) 2000 UNITS capsule Take 2,000 Units by mouth daily.    metoprolol succinate (TOPROL-XL) 50 MG 24 hr tablet Take 50 mg by mouth daily. Take with or immediately following a meal.    NON FORMULARY Inhale 1.5 L into the lungs daily.    Omega-3 Fatty Acids (FISH OIL) 1200 MG CAPS Take 1,200 mg by mouth daily.    oxyCODONE-acetaminophen (PERCOCET) 10-325 MG per tablet Take 1 tablet by mouth every 6 (six) hours as needed for pain (pain).    potassium chloride (K-DUR,KLOR-CON) 10 MEQ tablet Take 10 mEq by mouth daily.    ranitidine (ZANTAC) 150 MG tablet Take 150 mg by mouth 2 (two) times daily.    simvastatin (ZOCOR) 20 MG  tablet Take 20 mg by mouth daily at 6 PM.      STOP taking these medications     doxycycline (VIBRA-TABS) 100 MG tablet      hydrALAZINE (APRESOLINE) 25 MG tablet        No Known Allergies Follow-up Information    Follow up with SCHULTZ,DOUGLAS E, MD. Schedule an appointment as soon as possible for a visit in 1 week.   Specialty:  Internal Medicine   Why:  F/U IN 1-2 WEEKS   Contact information:   [place] [address] [phone]       Schedule an appointment as soon as possible for a visit with HAYES,JOHN C, MD.   Specialty:  Gastroenterology   Why:  F/U IN 2-4 WEEKS   Contact information:   [address] [place] [address] [phone]       Please follow up.   Why:  F/U WITH UROLOGIST IN 1 WEEK.       The results of significant diagnostics from this hospitalization (including imaging, microbiology, ancillary and laboratory) are listed  below for reference.    Significant Diagnostic Studies: Dg Chest Port 1 View  09/27/2014   CLINICAL DATA:  81 year old with current history of hypertension, heart failure, COPD and stage IV chronic kidney disease who was admitted with abdominal pain and fever possibly due to recent passage of a bile duct stone. He also complains of acute onset of shortness of breath earlier today.  EXAM: PORTABLE CHEST - 1 VIEW  COMPARISON:  09/26/2014 and earlier.  FINDINGS: Since yesterday's examination, interval development of mild diffuse interstitial pulmonary edema and possible small bilateral pleural effusions. No confluent airspace consolidation. Cardiac silhouette moderately enlarged, unchanged. Left subclavian pacing defibrillator unchanged and appears intact.  IMPRESSION: Acute CHF with mild diffuse interstitial pulmonary edema and probable small bilateral pleural effusions, new since yesterday.   Electronically Signed   By: Evangeline Dakin M.D.   On: 09/27/2014 20:55   Dg Ercp Biliary & Pancreatic Ducts  10/04/2014   CLINICAL DATA:  Common bile duct stone.  EXAM: ERCP  TECHNIQUE: Multiple spot images obtained with the fluoroscopic device and submitted for interpretation post-procedure.  FLUOROSCOPY TIME:  2 minutes, 27 seconds  COMPARISON:  CT abdomen pelvis -09/26/2007  FINDINGS: Two spot intraoperative fluoroscopic images of the right upper abdominal quadrant during ERCP are provided for review.  Initial image demonstrates an ERCP probe overlying the right upper abdominal quadrant. Surgical clips overlie the expected location of the gallbladder fossa. There is selective cannulation and opacification of the CBD which appears at least moderately dilated. There are no definitive opacities seen with the opacified portions of the biliary tree, however adjacent to the medial aspect of the insertion of the CBD are 2 discrete opacities which may correlate with the stones seen regional to the pancreatic head on  preceding abdominal CT.  Subsequent image demonstrates partial retract of the ERCP probe with a minimal amount of residual contrast within the CBD. The adjacent discrete opacities appear unchanged in position/location.  There is no definitive opacification of the pancreatic duct.  IMPRESSION: 1. ERCP with persistent findings of at least moderate dilatation of the CBD. 2. While there is no definitive evidence of choledocholithiasis, there are 2 discrete opacities adjacent to the medial aspect of the insertion of CBD which likely correlate with the laminated calcifications seen regional to the pancreatic head on preceding noncontrast abdominal CT. Correlation the operative report is recommended.  These images were submitted for radiologic interpretation only. Please see the procedural report for the amount of contrast and the fluoroscopy time utilized.   Electronically Signed   By: Sandi Mariscal M.D.   On: 10/04/2014 10:21    Microbiology: Recent Results (from the past 240 hour(s))  Urine culture     Status: None   Collection Time: 09/27/14  2:15 PM  Result Value Ref Range Status   Specimen Description URINE, RANDOM  Final   Special Requests NONE  Final   Culture NO GROWTH 1 DAY  Final   Report Status 09/28/2014 FINAL  Final     Labs: Basic Metabolic Panel:  Recent Labs Lab 10/01/14 0320 10/02/14 0350 10/03/14 0321 10/04/14 1145 10/05/14 0525 10/06/14 0244 10/07/14 0250  NA 139 139 140 141 138 137 135  K 3.5 3.9 5.4* 3.3* 3.5 3.5 4.0  CL 101 101 102 101 101 102 101  CO2 27 26 26 30 27 27 26   GLUCOSE 95 106* 102* 103* 125* 85 67  BUN 30* 35* 37* 30* 27* 27* 21*  CREATININE 1.80* 1.77* 1.83* 1.99* 2.02* 1.88* 1.71*  CALCIUM 9.2 9.5 9.0 9.0 8.9 8.3* 8.6*  MG 2.0 2.0  --   --   --   --   --    Liver Function Tests:  Recent Labs Lab 10/03/14 0321 10/04/14 1145 10/05/14 0525 10/06/14 0244 10/07/14 0250  AST 52* 25 23 18 19   ALT 39 31 26 18 17   ALKPHOS 132* 133* 132* 101 100   BILITOT 2.0* 1.0 1.0 1.0 1.0  PROT 5.0* 5.6* 5.6* 5.1* 5.0*  ALBUMIN 2.7* 2.9* 2.7* 2.4* 2.4*    Recent Labs Lab 10/03/14 0321 10/04/14 1145 10/05/14 0525 10/06/14 0244 10/07/14 0250  LIPASE 588* 122* 515* 292* 184*   No results for input(s): AMMONIA in the last 168 hours. CBC:  Recent Labs Lab 10/02/14 0350 10/03/14 0533 10/06/14 0244  WBC 6.9 6.2 6.2  NEUTROABS  --  4.5  --   HGB 11.2* 10.2* 9.5*  HCT 36.3* 33.3* 31.6*  MCV 81.8 82.6 85.4  PLT 155 129* 168   Cardiac Enzymes: No results for input(s): CKTOTAL, CKMB, CKMBINDEX, TROPONINI in the last 168 hours. BNP: BNP (last 3 results) No results for input(s): BNP in the last 8760 hours.  ProBNP (last 3 results) No results for input(s): PROBNP in the last 8760 hours.  CBG:  Recent Labs Lab 10/06/14 1256 10/06/14 1741 10/06/14 2201 10/07/14 0742 10/07/14 1209  GLUCAP 70 112* 94 77 105*       Signed:  THOMPSON,DANIEL MD [facility] 10/07/2014, 12:30 PM

## 2014-10-07 NOTE — Clinical Social Work Note (Signed)
Patient to be discharged home. CSW signing off.  Marcelline Deist, [place] - [phone] Clinical Social Work Department Orthopedics [phone]) and Surgical [phone])

## 2014-10-07 NOTE — Care Management Note (Signed)
Case Management Note  Patient Details  Name: Brandon Floyd MRN: [id number] Date of Birth: 12-23-1925  Subjective/Objective:                    Action/Plan: Daughter [name] [phone] decided on [facility] for PT,OT,SW, aide .   Spoke with [facility] with [facility] [phone] , referral faxed [phone]  Expected Discharge Date:  10/01/14               Expected Discharge Plan:  Home w Home Health Services  In-House Referral:     Discharge planning Services  CM Consult  Post Acute Care Choice:  Home Health Choice offered to:  Adult Children  DME Arranged:    DME Agency:     HH Arranged:  PT, OT, Nurse's Aide HH Agency:  [facility]  Status of Service:  Completed, signed off  Medicare Important Message Given:  Yes-third notification given Date Medicare IM Given:    Medicare IM give by:    Date Additional Medicare IM Given:    Additional Medicare Important Message give by:     If discussed at Long Length of Stay Meetings, dates discussed:    Additional Comments:  Kingsley Plan, RN 10/07/2014, 1:19 PM

## 2014-12-10 DIAGNOSIS — D631 Anemia in chronic kidney disease: Secondary | ICD-10-CM | POA: Diagnosis not present

## 2014-12-10 DIAGNOSIS — D509 Iron deficiency anemia, unspecified: Secondary | ICD-10-CM | POA: Diagnosis not present

## 2014-12-10 DIAGNOSIS — N184 Chronic kidney disease, stage 4 (severe): Secondary | ICD-10-CM | POA: Diagnosis not present

## 2014-12-10 DIAGNOSIS — E538 Deficiency of other specified B group vitamins: Secondary | ICD-10-CM | POA: Diagnosis not present

## 2015-12-09 DEATH — deceased

## 2016-10-13 IMAGING — CR DG CHEST 1V PORT
1 series · 1 of 1 positions shown · non-contrast
Comparison: 09/26/2014 and earlier.

CLINICAL DATA: 88-year-old with current history of hypertension,
heart failure, COPD and stage IV chronic kidney disease who was
admitted with abdominal pain and fever possibly due to recent
passage of a bile duct stone. He also complains of acute onset of
shortness of breath earlier today.

EXAM:
PORTABLE CHEST - 1 VIEW

[AP]
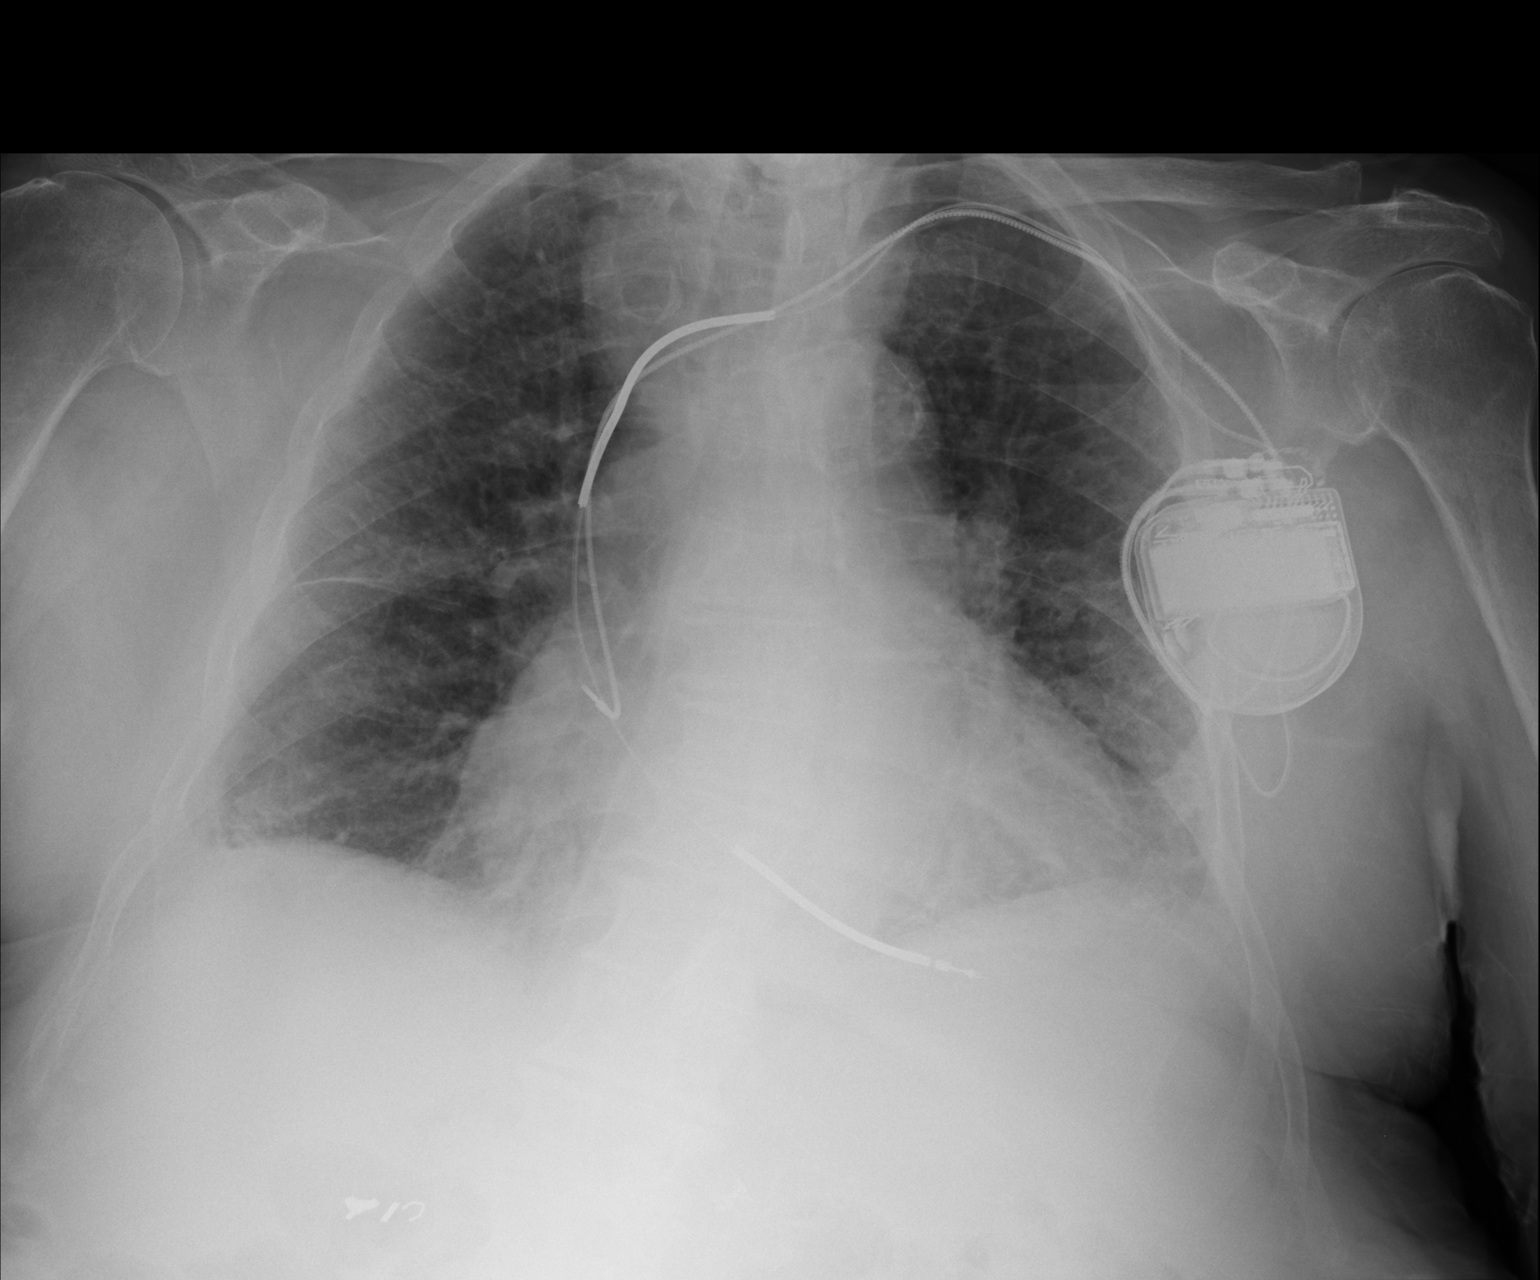

[1 of 1 positions shown; findings below may reference images not displayed]

FINDINGS: Since yesterday's examination, interval development of mild diffuse
interstitial pulmonary edema and possible small bilateral pleural
effusions. No confluent airspace consolidation. Cardiac silhouette
moderately enlarged, unchanged. Left subclavian pacing defibrillator
unchanged and appears intact.
IMPRESSION: Acute CHF with mild diffuse interstitial pulmonary edema and
probable small bilateral pleural effusions, new since yesterday.

## 2016-10-20 IMAGING — RF DG ERCP WO/W SPHINCTEROTOMY
1 series · 2 of 2 positions shown · non-contrast
Comparison: CT abdomen pelvis -09/26/2007

CLINICAL DATA: Common bile duct stone.

EXAM:
ERCP
TECHNIQUE: Multiple spot images obtained with the fluoroscopic device and
submitted for interpretation post-procedure.
FLUOROSCOPY TIME:  2 minutes, 27 seconds

[Series 1: run · 2 of 2 slices shown]
[im 1/2]
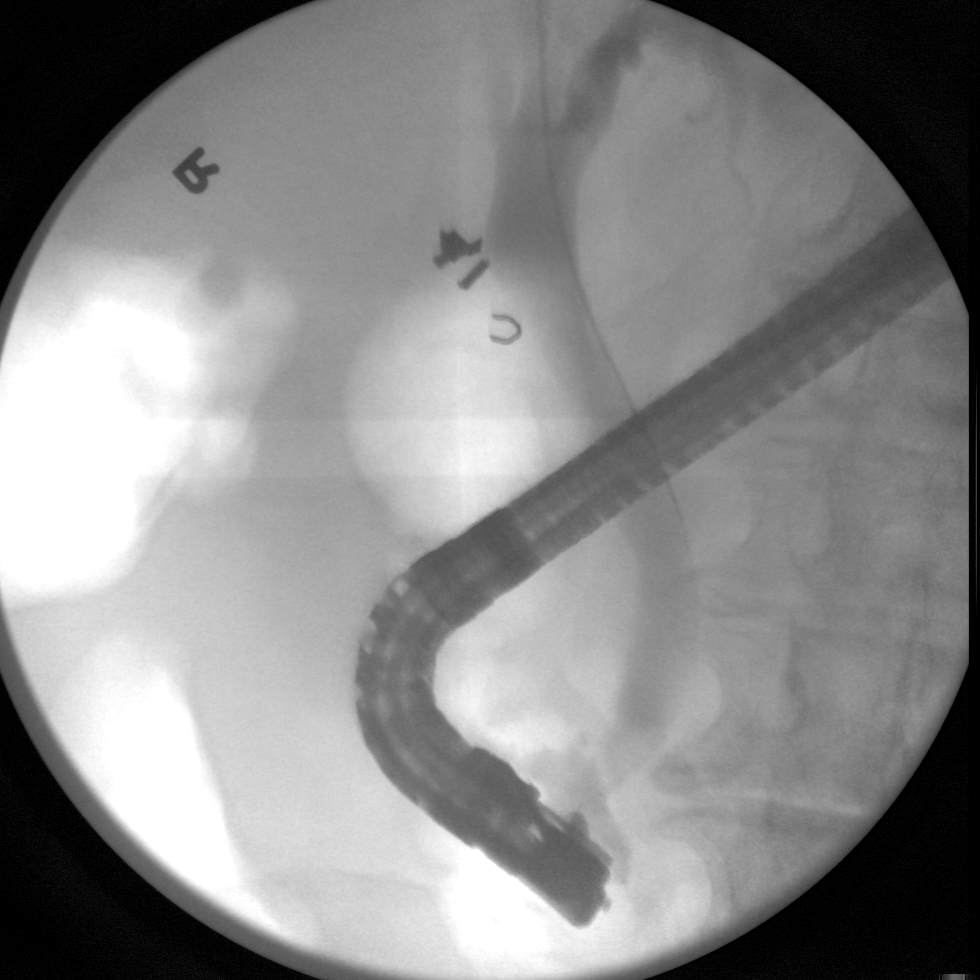
[im 2/2]
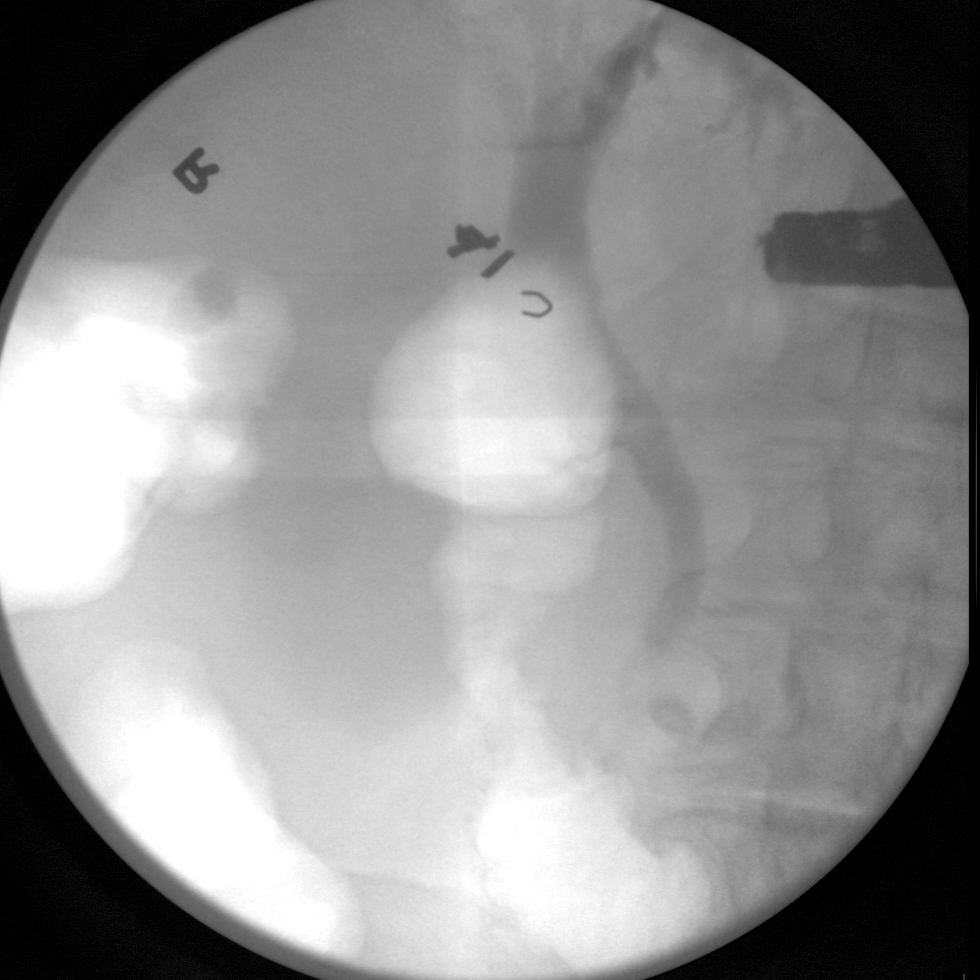

[2 of 2 positions shown; findings below may reference images not displayed]

FINDINGS: Two spot intraoperative fluoroscopic images of the right upper
abdominal quadrant during ERCP are provided for review.

Initial image demonstrates an ERCP probe overlying the right upper
abdominal quadrant. Surgical clips overlie the expected location of
the gallbladder fossa. There is selective cannulation and
opacification of the CBD which appears at least moderately dilated.
There are no definitive opacities seen with the opacified portions
of the biliary tree, however adjacent to the medial aspect of the
insertion of the CBD are 2 discrete opacities which may correlate
with the stones seen regional to the pancreatic head on preceding
abdominal CT.

Subsequent image demonstrates partial retract of the ERCP probe with
a minimal amount of residual contrast within the CBD. The adjacent
discrete opacities appear unchanged in position/location.

There is no definitive opacification of the pancreatic duct.
IMPRESSION: 1. ERCP with persistent findings of at least moderate dilatation of
the CBD.
2. While there is no definitive evidence of choledocholithiasis,
there are 2 discrete opacities adjacent to the medial aspect of the
insertion of CBD which likely correlate with the laminated
calcifications seen regional to the pancreatic head on preceding
noncontrast abdominal CT. Correlation the operative report is
recommended.
These images were submitted for radiologic interpretation only.
Please see the procedural report for the amount of contrast and the
fluoroscopy time utilized.
